# Patient Record
Sex: Female | Born: 1965 | Hispanic: Yes | Marital: Single | Smoking: Never smoker
Health system: Southern US, Community
[De-identification: ages and names within clinical notes are randomized; demographics above are authoritative.]

## PROBLEM LIST (undated history)

## (undated) DIAGNOSIS — K8020 Calculus of gallbladder without cholecystitis without obstruction: Secondary | ICD-10-CM

## (undated) DIAGNOSIS — R7303 Prediabetes: Secondary | ICD-10-CM

## (undated) DIAGNOSIS — K2970 Gastritis, unspecified, without bleeding: Secondary | ICD-10-CM

## (undated) HISTORY — PX: OTHER SURGICAL HISTORY: SHX169

## (undated) HISTORY — DX: Calculus of gallbladder without cholecystitis without obstruction: K80.20

---

## 2004-06-03 ENCOUNTER — Ambulatory Visit: Payer: Self-pay | Admitting: Obstetrics and Gynecology

## 2005-11-14 ENCOUNTER — Inpatient Hospital Stay: Payer: Self-pay

## 2007-08-17 ENCOUNTER — Ambulatory Visit: Payer: Self-pay

## 2013-03-22 LAB — HM HIV SCREENING LAB: HM HIV Screening: NEGATIVE

## 2014-11-20 DIAGNOSIS — E669 Obesity, unspecified: Secondary | ICD-10-CM | POA: Insufficient documentation

## 2015-04-19 ENCOUNTER — Encounter: Payer: Self-pay | Admitting: Adult Health

## 2015-04-19 DIAGNOSIS — Z833 Family history of diabetes mellitus: Secondary | ICD-10-CM

## 2015-04-19 DIAGNOSIS — E663 Overweight: Secondary | ICD-10-CM | POA: Diagnosis present

## 2015-04-19 DIAGNOSIS — Z6829 Body mass index (BMI) 29.0-29.9, adult: Secondary | ICD-10-CM

## 2015-04-19 DIAGNOSIS — K8020 Calculus of gallbladder without cholecystitis without obstruction: Secondary | ICD-10-CM | POA: Diagnosis present

## 2015-04-19 DIAGNOSIS — K851 Biliary acute pancreatitis without necrosis or infection: Principal | ICD-10-CM | POA: Diagnosis present

## 2015-04-19 DIAGNOSIS — E876 Hypokalemia: Secondary | ICD-10-CM | POA: Diagnosis present

## 2015-04-19 MED ORDER — GI COCKTAIL ~~LOC~~
30.0000 mL | Freq: Once | ORAL | Status: AC
  Administered 2015-04-19: 30 mL via ORAL
  Filled 2015-04-19: qty 30

## 2015-04-19 NOTE — ED Notes (Signed)
Presents with epigastric pain began at 10 pm tonight described as intermittent and "like air is trapped in my stomach" pain began after eating beef, rice and tortillas this evening. Pain is associated with Feeling dizzy and bilateral hand numbness. Pt is  Hyperventilating. Denies nauseas and SOB.  Deep breaths and sitting down makes pain worse.

## 2015-04-20 ENCOUNTER — Inpatient Hospital Stay
Admission: EM | Admit: 2015-04-20 | Discharge: 2015-04-23 | DRG: 419 | Disposition: A | Discharge status: Home / Self Care | Payer: Medicaid Other | Attending: Internal Medicine | Admitting: Internal Medicine

## 2015-04-20 ENCOUNTER — Encounter: Payer: Self-pay | Admitting: Internal Medicine

## 2015-04-20 ENCOUNTER — Emergency Department: Payer: Medicaid Other

## 2015-04-20 DIAGNOSIS — E876 Hypokalemia: Secondary | ICD-10-CM | POA: Diagnosis present

## 2015-04-20 DIAGNOSIS — K851 Biliary acute pancreatitis without necrosis or infection: Secondary | ICD-10-CM | POA: Diagnosis present

## 2015-04-20 DIAGNOSIS — E663 Overweight: Secondary | ICD-10-CM | POA: Diagnosis present

## 2015-04-20 DIAGNOSIS — K8020 Calculus of gallbladder without cholecystitis without obstruction: Secondary | ICD-10-CM | POA: Diagnosis present

## 2015-04-20 DIAGNOSIS — K810 Acute cholecystitis: Secondary | ICD-10-CM

## 2015-04-20 DIAGNOSIS — Z6829 Body mass index (BMI) 29.0-29.9, adult: Secondary | ICD-10-CM | POA: Diagnosis not present

## 2015-04-20 DIAGNOSIS — Z833 Family history of diabetes mellitus: Secondary | ICD-10-CM | POA: Diagnosis not present

## 2015-04-20 DIAGNOSIS — K858 Other acute pancreatitis without necrosis or infection: Secondary | ICD-10-CM

## 2015-04-20 HISTORY — DX: Gastritis, unspecified, without bleeding: K29.70

## 2015-04-20 LAB — COMPREHENSIVE METABOLIC PANEL WITH GFR
ALT: 49 U/L (ref 14–54)
AST: 64 U/L — ABNORMAL HIGH (ref 15–41)
Albumin: 4.2 g/dL (ref 3.5–5.0)
Alkaline Phosphatase: 78 U/L (ref 38–126)
Anion gap: 11 (ref 5–15)
BUN: 12 mg/dL (ref 6–20)
CO2: 22 mmol/L (ref 22–32)
Calcium: 8.9 mg/dL (ref 8.9–10.3)
Chloride: 102 mmol/L (ref 101–111)
Creatinine, Ser: 0.74 mg/dL (ref 0.44–1.00)
GFR calc Af Amer: >60 mL/min
GFR calc non Af Amer: >60 mL/min
Glucose, Bld: 151 mg/dL — ABNORMAL HIGH (ref 65–99)
Potassium: 3 mmol/L — ABNORMAL LOW (ref 3.5–5.1)
Sodium: 135 mmol/L (ref 135–145)
Total Bilirubin: 0.5 mg/dL (ref 0.3–1.2)
Total Protein: 7.4 g/dL (ref 6.5–8.1)

## 2015-04-20 LAB — CBC
HEMATOCRIT: 37 % (ref 35.0–47.0)
Hemoglobin: 12.5 g/dL (ref 12.0–16.0)
MCH: 28 pg (ref 26.0–34.0)
MCHC: 33.7 g/dL (ref 32.0–36.0)
MCV: 82.9 fL (ref 80.0–100.0)
Platelets: 193 10*3/uL (ref 150–440)
RBC: 4.46 MIL/uL (ref 3.80–5.20)
RDW: 13 % (ref 11.5–14.5)
WBC: 12.9 10*3/uL — AB (ref 3.6–11.0)

## 2015-04-20 LAB — TSH: TSH: 1.259 u[IU]/mL (ref 0.350–4.500)

## 2015-04-20 LAB — HEMOGLOBIN A1C: Hgb A1c MFr Bld: 5.9 % (ref 4.0–6.0)

## 2015-04-20 LAB — TROPONIN I: Troponin I: <0.03 ng/mL (ref <0.031)

## 2015-04-20 LAB — LIPASE, BLOOD: LIPASE: 3514 U/L — AB (ref 11–51)

## 2015-04-20 MED ORDER — ACETAMINOPHEN 325 MG PO TABS: 650.0000 mg | ORAL_TABLET | Freq: Four times a day (QID) | ORAL | Status: DC | PRN

## 2015-04-20 MED ORDER — MORPHINE SULFATE (PF) 2 MG/ML IV SOLN
2.0000 mg | Freq: Once | INTRAVENOUS | Status: AC
  Administered 2015-04-20: 2 mg via INTRAVENOUS

## 2015-04-20 MED ORDER — ONDANSETRON HCL 4 MG/2ML IJ SOLN
INTRAMUSCULAR | Status: AC
  Administered 2015-04-20: 4 mg via INTRAVENOUS
  Filled 2015-04-20: qty 2

## 2015-04-20 MED ORDER — MORPHINE SULFATE (PF) 2 MG/ML IV SOLN
2.0000 mg | INTRAVENOUS | Status: DC | PRN
Start: 2015-04-20 — End: 2015-04-23
  Administered 2015-04-20 – 2015-04-22 (×2): 2 mg via INTRAVENOUS
  Filled 2015-04-20 (×2): qty 1

## 2015-04-20 MED ORDER — ONDANSETRON HCL 4 MG/2ML IJ SOLN
4.0000 mg | Freq: Four times a day (QID) | INTRAMUSCULAR | Status: DC | PRN
  Administered 2015-04-22: 4 mg via INTRAVENOUS

## 2015-04-20 MED ORDER — POTASSIUM CHLORIDE IN NACL 40-0.9 MEQ/L-% IV SOLN
INTRAVENOUS | Status: DC
  Administered 2015-04-20 – 2015-04-22 (×6): 125 mL/h via INTRAVENOUS
  Filled 2015-04-20 (×9): qty 1000

## 2015-04-20 MED ORDER — MORPHINE SULFATE (PF) 2 MG/ML IV SOLN
INTRAVENOUS | Status: AC
  Administered 2015-04-20: 2 mg via INTRAVENOUS
  Filled 2015-04-20: qty 1

## 2015-04-20 MED ORDER — DEXTROSE 5 % IV SOLN
1.0000 g | INTRAVENOUS | Status: DC
  Administered 2015-04-20 – 2015-04-22 (×3): 1 g via INTRAVENOUS
  Filled 2015-04-20 (×4): qty 10

## 2015-04-20 MED ORDER — ACETAMINOPHEN 650 MG RE SUPP: 650.0000 mg | Freq: Four times a day (QID) | RECTAL | Status: DC | PRN

## 2015-04-20 MED ORDER — ONDANSETRON HCL 4 MG/2ML IJ SOLN
4.0000 mg | Freq: Once | INTRAMUSCULAR | Status: AC
  Administered 2015-04-20: 4 mg via INTRAVENOUS

## 2015-04-20 MED ORDER — PANTOPRAZOLE SODIUM 40 MG IV SOLR
40.0000 mg | INTRAVENOUS | Status: DC
  Administered 2015-04-20 – 2015-04-22 (×3): 40 mg via INTRAVENOUS
  Filled 2015-04-20 (×3): qty 40

## 2015-04-20 MED ORDER — HEPARIN SODIUM (PORCINE) 5000 UNIT/ML IJ SOLN
5000.0000 [IU] | Freq: Three times a day (TID) | INTRAMUSCULAR | Status: DC
  Administered 2015-04-20 – 2015-04-23 (×10): 5000 [IU] via SUBCUTANEOUS
  Filled 2015-04-20 (×10): qty 1

## 2015-04-20 MED ORDER — ONDANSETRON HCL 4 MG PO TABS: 4.0000 mg | ORAL_TABLET | Freq: Four times a day (QID) | ORAL | Status: DC | PRN

## 2015-04-20 NOTE — H&P (Signed)
Heather Branch is an 49 y.o. female.   Chief Complaint: Abdominal pain HPI: The patient presents emergency department complaining of abdominal pain. It began 2 days ago and has gradually worsened. Patient denies vomiting but has had areas of nausea. She denies fever, chest pain or shortness of breath. In the emergency department laboratory evaluation revealed pancreatitis. CT of the abdomen revealed gallstones. Due to these findings the emergency department called for admission   Past Medical History  Diagnosis Date  . Gastritis     Past Surgical History  Procedure Laterality Date  . None      Family History  Problem Relation Age of Onset  . Diabetes Mellitus II Father    Social History:  reports that she has never smoked. She does not have any smokeless tobacco history on file. She reports that she does not drink alcohol or use illicit drugs.  Allergies: No Known Allergies  Medications Prior to Admission  Medication Sig Dispense Refill  . omeprazole (PRILOSEC OTC) 20 MG tablet Take 20 mg by mouth daily as needed.      Results for orders placed or performed during the hospital encounter of 04/20/15 (from the past 48 hour(s))  Lipase, blood     Status: Abnormal   Collection Time: 04/19/15 11:41 PM  Result Value Ref Range   Lipase 3514 (H) 11 - 51 U/L    Comment: Please note change in reference range.  Comprehensive metabolic panel     Status: Abnormal   Collection Time: 04/19/15 11:41 PM  Result Value Ref Range   Sodium 135 135 - 145 mmol/L   Potassium 3.0 (L) 3.5 - 5.1 mmol/L   Chloride 102 101 - 111 mmol/L   CO2 22 22 - 32 mmol/L   Glucose, Bld 151 (H) 65 - 99 mg/dL   BUN 12 6 - 20 mg/dL   Creatinine, Ser 0.74 0.44 - 1.00 mg/dL   Calcium 8.9 8.9 - 10.3 mg/dL   Total Protein 7.4 6.5 - 8.1 g/dL   Albumin 4.2 3.5 - 5.0 g/dL   AST 64 (H) 15 - 41 U/L   ALT 49 14 - 54 U/L   Alkaline Phosphatase 78 38 - 126 U/L   Total Bilirubin 0.5 0.3 - 1.2 mg/dL   GFR calc non Af  Amer >60 >60 mL/min   GFR calc Af Amer >60 >60 mL/min    Comment: (NOTE) The eGFR has been calculated using the CKD EPI equation. This calculation has not been validated in all clinical situations. eGFR's persistently <60 mL/min signify possible Chronic Kidney Disease.    Anion gap 11 5 - 15  CBC     Status: Abnormal   Collection Time: 04/19/15 11:41 PM  Result Value Ref Range   WBC 12.9 (H) 3.6 - 11.0 K/uL   RBC 4.46 3.80 - 5.20 MIL/uL   Hemoglobin 12.5 12.0 - 16.0 g/dL   HCT 37.0 35.0 - 47.0 %   MCV 82.9 80.0 - 100.0 fL   MCH 28.0 26.0 - 34.0 pg   MCHC 33.7 32.0 - 36.0 g/dL   RDW 13.0 11.5 - 14.5 %   Platelets 193 150 - 440 K/uL  Troponin I     Status: None   Collection Time: 04/19/15 11:41 PM  Result Value Ref Range   Troponin I <0.03 <0.031 ng/mL    Comment:        NO INDICATION OF MYOCARDIAL INJURY.   TSH     Status: None   Collection  Time: 04/20/15  6:00 AM  Result Value Ref Range   TSH 1.259 0.350 - 4.500 uIU/mL   US Abdomen Limited  04/20/2015  CLINICAL DATA:  Acute onset of right upper quadrant and epigastric pain. Elevated lipase. Initial encounter. EXAM: US ABDOMEN LIMITED - RIGHT UPPER QUADRANT COMPARISON:  None. FINDINGS: Gallbladder: The gallbladder is largely contracted, with multiple stones seen in the gallbladder, including a 1.8 cm stone near the gallbladder neck. There is associated gallbladder wall thickening, thought to be chronic in nature. No pericholecystic fluid is seen. No ultrasonographic Murphy's sign is elicited. Common bile duct: Diameter: 0.3 cm, within normal limits in caliber. Liver: No focal lesion identified. Within normal limits in parenchymal echogenicity. IMPRESSION: Gallbladder largely contracted, with multiple stones in the gallbladder, including a 1.8 cm stone near the gallbladder neck. Associated chronic gallbladder wall thickening, thought to reflect chronic inflammation. No evidence for obstruction or acute cholecystitis. Electronically  Signed   By: Garald Balding M.D.   On: 04/20/2015 04:24    Review of Systems  Constitutional: Negative for fever and chills.  HENT: Negative for sore throat and tinnitus.   Eyes: Negative for blurred vision and redness.  Respiratory: Negative for cough and shortness of breath.   Cardiovascular: Negative for chest pain, palpitations, orthopnea and PND.  Gastrointestinal: Positive for nausea and abdominal pain. Negative for vomiting and diarrhea.  Genitourinary: Negative for dysuria, urgency and frequency.  Musculoskeletal: Negative for myalgias and joint pain.  Skin: Negative for rash.       No lesions  Neurological: Negative for speech change, focal weakness and weakness.  Endo/Heme/Allergies: Does not bruise/bleed easily.       No temperature intolerance  Psychiatric/Behavioral: Negative for depression and suicidal ideas.    Blood pressure 112/50, pulse 67, temperature 97.5 F (36.4 C), temperature source Oral, resp. rate 16, height $RemoveBe'5\' 3"'tYwtMgihx$  (1.6 m), weight 74.299 kg (163 lb 12.8 oz), last menstrual period 03/20/2015, SpO2 99 %. Physical Exam  Nursing note and vitals reviewed. Constitutional: She is oriented to person, place, and time. She appears well-developed and well-nourished. No distress.  HENT:  Head: Normocephalic and atraumatic.  Mouth/Throat: Oropharynx is clear and moist.  Eyes: Conjunctivae and EOM are normal. Pupils are equal, round, and reactive to light. No scleral icterus.  Neck: Normal range of motion. Neck supple. No JVD present. No tracheal deviation present. No thyromegaly present.  Cardiovascular: Normal rate, regular rhythm and normal heart sounds.  Exam reveals no gallop and no friction rub.   No murmur heard. Respiratory: Effort normal and breath sounds normal.  GI: Soft. Bowel sounds are normal. She exhibits no distension and no mass. There is tenderness. There is no rebound and no guarding.  Genitourinary:  Deferred  Musculoskeletal: Normal range of motion.  She exhibits no edema or tenderness.  Lymphadenopathy:    She has no cervical adenopathy.  Neurological: She is alert and oriented to person, place, and time. No cranial nerve deficit. She exhibits normal muscle tone.  Skin: Skin is warm and dry. No rash noted. No erythema.  Psychiatric: She has a normal mood and affect. Her behavior is normal. Judgment and thought content normal.     Assessment/Plan This is a 49 year old Hispanic female admitted for gallstone pancreatitis. 1. Gallstone pancreatitis: The patient is nothing by mouth. I have given her a dose of ceftriaxone and placed gastroenterology consult for possible ERCP. Manage nausea and abdominal pain. 2. Leukocytosis: No signs or symptoms of sepsis  3. Overweight: BMI is 29.1;  encouraged healthy diet and exercise 4. DVT prophylaxis: Heparin 5. GI prophylaxis: None The patient is a full code. Time spent on admission was inpatient care approximately 35 minutes   Harrie Foreman 04/20/2015, 8:01 AM

## 2015-04-20 NOTE — Progress Notes (Signed)
Initial Nutrition Assessment   INTERVENTION:   Coordination of Care: await diet progression as medically able   NUTRITION DIAGNOSIS:   Inadequate oral intake related to inability to eat as evidenced by NPO status.  GOAL:   Patient will meet greater than or equal to 90% of their needs  MONITOR:    (Energy Intake, Gastrointestinal Profile)  REASON FOR ASSESSMENT:   Diagnosis    ASSESSMENT:   Pt admitted with pancreatitis secondary to gallstones present on CT pre MD note; GI consult pending. Pt resting this am on visit.  Past Medical History  Diagnosis Date  . Gastritis     Diet Order:  Diet NPO time specified    Current Nutrition: Pt NPO   Food/Nutrition-Related History: Per MST no decrease in appetite PTA   Scheduled Medications:  . cefTRIAXone (ROCEPHIN)  IV  1 g Intravenous Q24H  . heparin  5,000 Units Subcutaneous 3 times per day    Continuous Medications:  . 0.9 % NaCl with KCl 40 mEq / L 125 mL/hr (04/20/15 0734)     Electrolyte/Renal Profile and Glucose Profile:   Recent Labs Lab 04/19/15 2341  NA 135  K 3.0*  CL 102  CO2 22  BUN 12  CREATININE 0.74  CALCIUM 8.9  GLUCOSE 151*   Protein Profile:  Recent Labs Lab 04/19/15 2341  ALBUMIN 4.2    Gastrointestinal Profile: Last BM: unknown   Nutrition-Focused Physical Exam Findings:  Unable to complete Nutrition-Focused physical exam at this time.     Weight Change: Per MST no decrease in weight PTA   Height:   Ht Readings from Last 1 Encounters:  04/20/15 5\' 3"  (1.6 m)    Weight:   Wt Readings from Last 1 Encounters:  04/20/15 163 lb 12.8 oz (74.299 kg)     BMI:  Body mass index is 29.02 kg/(m^2).  Estimated Nutritional Needs:   Kcal:  BEE: 1339kcals, TEE: (IF 1.1-1.3)(AF 1.2) [id number]  Protein:  59-74g protein (0.8-1.0g/kg)  Fluid:  1850-222820mL of fluid (25-2630mL/kg)  EDUCATION NEEDS:   Education needs no appropriate at this time   MODERATE Care  Level  Leda QuailAllyson Morgana Rowley, RD, LDN Pager [phone]

## 2015-04-20 NOTE — ED Notes (Signed)
Translator left pt room.

## 2015-04-20 NOTE — ED Notes (Signed)
Marsha on 2c updated with pt status and meds admin'd

## 2015-04-20 NOTE — ED Provider Notes (Signed)
[facility] Emergency Department Provider Note  ____________________________________________  Time seen: 1:40 AM  I have reviewed the triage vital signs and the nursing notes.   HISTORY  Chief Complaint Abdominal Pain     HPI Heather Branch is a 49 y.o. female presents with epigastric abdominal pain that is currently 5 out of 10 onset at 10:00 PM tonight. Patient states that she ate approximately 7:00 started having severe abdominal pain at 10. Patient admits to one prior episode of same that lasted approximately one hour with spontaneous resolution. Patient describes the pain as intermittent but never completely resolved. Patient denies any fever, admits to nausea but no vomiting.    Past medical history None There are no active problems to display for this patient.   Past surgical history None No current outpatient prescriptions on file.  Allergies Review of patient's allergies indicates no known allergies.  History reviewed. No pertinent family history.  Social History Social History  Substance Use Topics  . Smoking status: Never Smoker   . Smokeless tobacco: None  . Alcohol Use: No    Review of Systems  Constitutional: Negative for fever. Eyes: Negative for visual changes. ENT: Negative for sore throat. Cardiovascular: Negative for chest pain. Respiratory: Negative for shortness of breath. Gastrointestinal: Positive for abdominal pain, negative for vomiting and diarrhea. Genitourinary: Negative for dysuria. Musculoskeletal: Negative for back pain. Skin: Negative for rash. Neurological: Negative for headaches, focal weakness or numbness.   10-point ROS otherwise negative.  ____________________________________________   PHYSICAL EXAM:  VITAL SIGNS: ED Triage Vitals  Enc Vitals Group     BP 04/19/15 2335 133/65 mmHg     Pulse Rate 04/19/15 2335 80     Resp 04/19/15 2335 18     Temp 04/19/15 2335 97.8 F (36.6 C)      Temp src --      SpO2 04/19/15 2335 98 %     Weight 04/19/15 2335 170 lb (77.111 kg)     Height 04/19/15 2335 5\' 3"  (1.6 m)     Head Cir --      Peak Flow --      Pain Score 04/19/15 2336 9     Pain Loc --      Pain Edu? --      Excl. in GC? --      Constitutional: Alert and oriented. Well appearing and in no distress. Eyes: Conjunctivae are normal. PERRL. Normal extraocular movements. ENT   Head: Normocephalic and atraumatic.   Nose: No congestion/rhinnorhea.   Mouth/Throat: Mucous membranes are moist.   Neck: No stridor. Hematological/Lymphatic/Immunilogical: No cervical lymphadenopathy. Cardiovascular: Normal rate, regular rhythm. Normal and symmetric distal pulses are present in all extremities. No murmurs, rubs, or gallops. Respiratory: Normal respiratory effort without tachypnea nor retractions. Breath sounds are clear and equal bilaterally. No wheezes/rales/rhonchi. Gastrointestinal: Right upper quadrant/epigastric pain with palpation. No distention. There is no CVA tenderness. Genitourinary: deferred Musculoskeletal: Nontender with normal range of motion in all extremities. No joint effusions.  No lower extremity tenderness nor edema. Neurologic:  Normal speech and language. No gross focal neurologic deficits are appreciated. Speech is normal.  Skin:  Skin is warm, dry and intact. No rash noted. Psychiatric: Mood and affect are normal. Speech and behavior are normal. Patient exhibits appropriate insight and judgment.  ____________________________________________    LABS (pertinent positives/negatives) Labs Reviewed  LIPASE, BLOOD - Abnormal; Notable for the following:    Lipase 3514 (*)    All other components within normal  limits  COMPREHENSIVE METABOLIC PANEL - Abnormal; Notable for the following:    Potassium 3.0 (*)    Glucose, Bld 151 (*)    AST 64 (*)    All other components within normal limits  CBC - Abnormal; Notable for the following:    WBC  12.9 (*)    All other components within normal limits  TROPONIN I     RADIOLOGY     US Abdomen Limited (Final result) Result time: 04/20/15 04:24:10   Final result by Rad Results In Interface (04/20/15 04:24:10)   Narrative:   CLINICAL DATA: Acute onset of right upper quadrant and epigastric pain. Elevated lipase. Initial encounter.  EXAM: US ABDOMEN LIMITED - RIGHT UPPER QUADRANT  COMPARISON: None.  FINDINGS: Gallbladder:  The gallbladder is largely contracted, with multiple stones seen in the gallbladder, including a 1.8 cm stone near the gallbladder neck. There is associated gallbladder wall thickening, thought to be chronic in nature. No pericholecystic fluid is seen. No ultrasonographic Murphy's sign is elicited.  Common bile duct:  Diameter: 0.3 cm, within normal limits in caliber.  Liver:  No focal lesion identified. Within normal limits in parenchymal echogenicity.  IMPRESSION: Gallbladder largely contracted, with multiple stones in the gallbladder, including a 1.8 cm stone near the gallbladder neck. Associated chronic gallbladder wall thickening, thought to reflect chronic inflammation. No evidence for obstruction or acute cholecystitis.   Electronically Signed By: Roanna Raider M.D. On: 04/20/2015 04:24          INITIAL IMPRESSION / ASSESSMENT AND PLAN / ED COURSE  Pertinent labs & imaging results that were available during my care of the patient were reviewed by me and considered in my medical decision making (see chart for details).  Strip physical exam consistent with pancreatitis most likely secondary to gallstones as such patient underwent a ultrasound which revealed a before mentioned. Patient discussed with Dr. Sheryle Hail hospital admission for further evaluation and management.  ____________________________________________   FINAL CLINICAL IMPRESSION(S) / ED DIAGNOSES  Final diagnoses:  Other acute pancreatitis   Gallstone pancreatitis      Darci Current, MD 04/20/15 0502

## 2015-04-20 NOTE — Consult Note (Signed)
GI Inpatient Consult Note  Reason for Consult: pancreatitis   Attending Requesting Consult: Dr Allena Katz  History of Present Illness: Heather Branch is a 49 y.o. female with no significant past medical history who presented to the emergency room for evaluation of epigastric pain. Heather Branch reports she was in her usual state of health till 3 days prior to presentation. At that time she developed severe epigastric abdominal pain. Pain persisted for several days prompting admission to the emergency room. Not had pain like this prior. The pain was not accompanied by nausea or vomiting. He did not have trouble with rectal bleeding or black stools. She denies dysphagia, GERD. She does not drink alcohol and does not no other prior history of gallstones. Our no new medications. There is no family history of pancreatic disease.  In the emergency room she was noted to have an elevated lipase and was diagnosed with pancreatitis. She had an ultrasound of her right upper quadrant showing a large gallstone 2 cm in size in the gallbladder neck associated chronic inflammation of the gallbladder wall. All other gallstones in the gallbladder.  Today Heather Branch has improved some clinically. , Pain has decreased prior to presentation. She does continue to require IV pain medicine. She has not yet taken anything by mouth.  Past Medical History:  Past Medical History  Diagnosis Date  . Gastritis     Problem List: Patient Active Problem List   Diagnosis Date Noted  . Gallstone pancreatitis 04/20/2015    Past Surgical History: Past Surgical History  Procedure Laterality Date  . None      Allergies: No Known Allergies  Home Medications: Prescriptions prior to admission  Medication Sig Dispense Refill Last Dose  . omeprazole (PRILOSEC OTC) 20 MG tablet Take 20 mg by mouth daily as needed.   Past Month at prn   Home medication reconciliation was completed with the patient.   Scheduled Inpatient  Medications:   . cefTRIAXone (ROCEPHIN)  IV  1 g Intravenous Q24H  . heparin  5,000 Units Subcutaneous 3 times per day    Continuous Inpatient Infusions:   . 0.9 % NaCl with KCl 40 mEq / L 125 mL/hr (04/20/15 0734)    PRN Inpatient Medications:  acetaminophen **OR** acetaminophen, morphine injection, ondansetron **OR** ondansetron (ZOFRAN) IV  Family History: family history includes Diabetes Mellitus II in her father.    Social History:   reports that she has never smoked. She does not have any smokeless tobacco history on file. She reports that she does not drink alcohol or use illicit drugs.   Review of Systems: Constitutional: Weight is stable.  Eyes: No changes in vision. ENT: No oral lesions, sore throat.  GI: see HPI.  Heme/Lymph: No easy bruising.  CV: No chest pain.  GU: No hematuria.  Integumentary: No rashes.  Neuro: No headaches.  Psych: No depression/anxiety.  Endocrine: No heat/cold intolerance.  Allergic/Immunologic: No urticaria.  Resp: No cough, SOB.  Musculoskeletal: No joint swelling.    Physical Examination: BP 114/58 mmHg  Pulse 74  Temp(Src) 98.2 F (36.8 C) (Oral)  Resp 16  Ht  (1.6 m)  Wt 74.299 kg (163 lb 12.8 oz)  BMI 29.02 kg/m2  SpO2 97%  LMP 03/20/2015 (Approximate) Gen: NAD, alert and oriented x 4 HEENT: PEERLA, EOMI, Neck: supple, no JVD or thyromegaly Chest: CTA bilaterally, no wheezes, crackles, or other adventitious sounds CV: RRR, no m/g/c/r Abd: soft, + diffuse TTP, worse in epigastric region, +BS in all  four quadrants; no HSM, guarding, ridigity, or rebound tenderness, + abd adiposity Ext: no edema, well perfused with 2+ pulses, Skin: no rash or lesions noted Lymph: no LAD  Data: Lab Results  Component Value Date   WBC 12.9* 04/19/2015   HGB 12.5 04/19/2015   HCT 37.0 04/19/2015   MCV 82.9 04/19/2015   PLT 193 04/19/2015    Recent Labs Lab 04/19/15 2341  HGB 12.5   Lab Results  Component Value Date   NA  135 04/19/2015   K 3.0* 04/19/2015   CL 102 04/19/2015   CO2 22 04/19/2015   BUN 12 04/19/2015   CREATININE 0.74 04/19/2015   Lab Results  Component Value Date   ALT 49 04/19/2015   AST 64* 04/19/2015   ALKPHOS 78 04/19/2015   BILITOT 0.5 04/19/2015   No results for input(s): APTT, INR, PTT in the last 168 hours.   Assessment/Plan: Heather Branch is a 49 y.o. female with gallstone pancreatitis. Her lab work and ultrasound are not suggestive of any ongoing biliary obstruction or cholangitis. She is also improving somewhat clinically. There is no indication for ERCP at this time.  Recommendations: - Surgical consult for consideration of cholecystectomy - advance diet to clear liquids  avoiding fatty chicken broth - Continue to monitor liver enzymes - cont symptomatically management as you are doing - No indication for ERCP  Thank you for the consult. Please call with questions or concerns.  Kanya Potteiger, Addison NaegeliMATTHEW GORDON, MD

## 2015-04-20 NOTE — H&P (Signed)
Heather Branch is an 49 y.o. female.   Chief Complaint: Epigastric pain  HPI:  49 yr old Hispanic female, spoke through interpreter, presented to ED last night after having epigastric pain.  Patient states that is started about 10 pm after eating some beef,rice and torillas.  She has had a similar type pain in the past about 3 months ago but it only lasted for about 30 mins.  She states that the pain feels as if it bores through to her back but denies any migration otherwise. She states that she feels more bloated and has more gas pressure feeling, as if the gas won't pass.  At this time she says her pain is a 2 down from 8.  She denies any fever, chills, nausea, vomiting, diarrhea or constipation.   Past Medical History  Diagnosis Date  . Gastritis     Past Surgical History  Procedure Laterality Date  . None      Family History  Problem Relation Age of Onset  . Diabetes Mellitus II Father    Social History:  reports that she has never smoked. She does not have any smokeless tobacco history on file. She reports that she does not drink alcohol or use illicit drugs.  Allergies: No Known Allergies  Medications Prior to Admission  Medication Sig Dispense Refill  . omeprazole (PRILOSEC OTC) 20 MG tablet Take 20 mg by mouth daily as needed.      Results for orders placed or performed during the hospital encounter of 04/20/15 (from the past 48 hour(s))  Lipase, blood     Status: Abnormal   Collection Time: 04/19/15 11:41 PM  Result Value Ref Range   Lipase 3514 (H) 11 - 51 U/L    Comment: Please note change in reference range. RESULTS CONFIRMED BY MANUAL DILUTION   Comprehensive metabolic panel     Status: Abnormal   Collection Time: 04/19/15 11:41 PM  Result Value Ref Range   Sodium 135 135 - 145 mmol/L   Potassium 3.0 (L) 3.5 - 5.1 mmol/L   Chloride 102 101 - 111 mmol/L   CO2 22 22 - 32 mmol/L   Glucose, Bld 151 (H) 65 - 99 mg/dL   BUN 12 6 - 20 mg/dL   Creatinine, Ser 0.74  0.44 - 1.00 mg/dL   Calcium 8.9 8.9 - 10.3 mg/dL   Total Protein 7.4 6.5 - 8.1 g/dL   Albumin 4.2 3.5 - 5.0 g/dL   AST 64 (H) 15 - 41 U/L   ALT 49 14 - 54 U/L   Alkaline Phosphatase 78 38 - 126 U/L   Total Bilirubin 0.5 0.3 - 1.2 mg/dL   GFR calc non Af Amer >60 >60 mL/min   GFR calc Af Amer >60 >60 mL/min    Comment: (NOTE) The eGFR has been calculated using the CKD EPI equation. This calculation has not been validated in all clinical situations. eGFR's persistently <60 mL/min signify possible Chronic Kidney Disease.    Anion gap 11 5 - 15  CBC     Status: Abnormal   Collection Time: 04/19/15 11:41 PM  Result Value Ref Range   WBC 12.9 (H) 3.6 - 11.0 K/uL   RBC 4.46 3.80 - 5.20 MIL/uL   Hemoglobin 12.5 12.0 - 16.0 g/dL   HCT 37.0 35.0 - 47.0 %   MCV 82.9 80.0 - 100.0 fL   MCH 28.0 26.0 - 34.0 pg   MCHC 33.7 32.0 - 36.0 g/dL   RDW 13.0 11.5 -  14.5 %   Platelets 193 150 - 440 K/uL  Troponin I     Status: None   Collection Time: 04/19/15 11:41 PM  Result Value Ref Range   Troponin I <0.03 <0.031 ng/mL    Comment:        NO INDICATION OF MYOCARDIAL INJURY.   Hemoglobin A1c     Status: None   Collection Time: 04/20/15  6:00 AM  Result Value Ref Range   Hgb A1c MFr Bld 5.9 4.0 - 6.0 %  TSH     Status: None   Collection Time: 04/20/15  6:00 AM  Result Value Ref Range   TSH 1.259 0.350 - 4.500 uIU/mL   US Abdomen Limited  04/20/2015  CLINICAL DATA:  Acute onset of right upper quadrant and epigastric pain. Elevated lipase. Initial encounter. EXAM: US ABDOMEN LIMITED - RIGHT UPPER QUADRANT COMPARISON:  None. FINDINGS: Gallbladder: The gallbladder is largely contracted, with multiple stones seen in the gallbladder, including a 1.8 cm stone near the gallbladder neck. There is associated gallbladder wall thickening, thought to be chronic in nature. No pericholecystic fluid is seen. No ultrasonographic Murphy's sign is elicited. Common bile duct: Diameter: 0.3 cm, within normal  limits in caliber. Liver: No focal lesion identified. Within normal limits in parenchymal echogenicity. IMPRESSION: Gallbladder largely contracted, with multiple stones in the gallbladder, including a 1.8 cm stone near the gallbladder neck. Associated chronic gallbladder wall thickening, thought to reflect chronic inflammation. No evidence for obstruction or acute cholecystitis. Electronically Signed   By: Garald Balding M.D.   On: 04/20/2015 04:24    Review of Systems  Constitutional: Negative for fever, chills, weight loss, malaise/fatigue and diaphoresis.  HENT: Negative for congestion and sore throat.   Respiratory: Negative for shortness of breath and wheezing.   Cardiovascular: Negative for chest pain, palpitations and leg swelling.  Gastrointestinal: Positive for heartburn and abdominal pain. Negative for nausea, vomiting, diarrhea, constipation, blood in stool and melena.  Genitourinary: Negative for dysuria, urgency, frequency, hematuria and flank pain.  Musculoskeletal: Positive for back pain. Negative for myalgias, joint pain, falls and neck pain.  Skin: Negative for itching and rash.  Neurological: Negative for dizziness, tremors, loss of consciousness, weakness and headaches.  Psychiatric/Behavioral: Negative for memory loss. The patient does not have insomnia.   All other systems reviewed and are negative.   Blood pressure 114/58, pulse 74, temperature 98.2 F (36.8 C), temperature source Oral, resp. rate 16, height _0  (1.6 m), weight 163 lb 12.8 oz (74.299 kg), last menstrual period 03/20/2015, SpO2 97 %. Physical Exam  Vitals reviewed. Constitutional: She is oriented to person, place, and time. She appears well-developed and well-nourished. No distress.  HENT:  Head: Normocephalic and atraumatic.  Right Ear: External ear normal.  Left Ear: External ear normal.  Nose: Nose normal.  Mouth/Throat: Oropharynx is clear and moist. No oropharyngeal exudate.  Eyes: Conjunctivae  and EOM are normal. Pupils are equal, round, and reactive to light. No scleral icterus.  Neck: Normal range of motion. Neck supple. No tracheal deviation present.  Cardiovascular: Normal rate, regular rhythm, normal heart sounds and intact distal pulses.  Exam reveals no gallop and no friction rub.   No murmur heard. Respiratory: Effort normal and breath sounds normal. No respiratory distress. She has no wheezes. She has no rales.  GI: Soft. Bowel sounds are normal. She exhibits no distension. There is tenderness. There is no rebound and no guarding.  Epigastric tenderness, mild pain in RUQ but no murphy's  sign  Musculoskeletal: Normal range of motion. She exhibits no edema or tenderness.  Neurological: She is alert and oriented to person, place, and time. No cranial nerve deficit.  Skin: Skin is warm and dry. No rash noted. No erythema. No pallor.  Psychiatric: She has a normal mood and affect. Her behavior is normal. Judgment and thought content normal.     Assessment/Plan 49 yr old female with gallstone pancreatitis.  I have personally reviewed her lab, with a lipase of 3514 and otherwise normal liver enzymes.  I have reviewed the images showing a large stone in the gallbladder and some wall thickening, CBD 3 mm.  I have also reviewed the radiology reads.  I would recommend continuing NPO, will need Lap chole during this admission but ideally will perform when lipase normalizes.  Will start protonix, continue npo and recheck lipase in AM.    The patient and mother-in-law did ask if there were any medical options.  I explained that given pancreatitis and the severe risks that come with that the current recommendation would be removal of the gallbladder.  Also explained that medicine like Lucrezia Starch has not been proven to decrease the likelihood of recurrent gallstone pancreatitis or cholangitis in the future.   I discussed the risks, benefits, complications, treatment options, and expected outcomes  were discussed with the patient. The possibilities of bleeding, recurrent infection, finding a normal gallbladder, perforation of viscus organs, damage to surrounding structures, bile leak, abscess formation, needing a drain placed, the need for additional procedures, reaction to medication, pulmonary aspiration,  failure to diagnose a condition, the possible need to convert to an open procedure, and creating a complication requiring transfusion or operation were discussed with the patient. Patient and mother-in-law given the opportunity to ask questions and have them answered and agree with procedure.   I spent 90 mins with patient with over 50% of that time explaining the disease process, treatment options, education and coordination of care.   Heather Branch 04/20/2015, 5:55 PM

## 2015-04-20 NOTE — ED Notes (Signed)
requesting Spanish translator for tx

## 2015-04-20 NOTE — Progress Notes (Signed)
[facility] - [place] at [facility]                                                                                                                                                                                            Patient Demographics   Heather Branch, is a 49 y.o. female, DOB - [birth year], MWN:[id number]  Admit date - 04/20/2015   Admitting Physician Arnaldo Natal, MD  Outpatient Primary MD for the patient is No PCP Per Patient   LOS - 0  Subjective: Patient admitted with severe abdominal pain noted have acute pancreatitis     Review of Systems:   CONSTITUTIONAL: No documented fever. No fatigue, weakness. No weight gain, no weight loss.  EYES: No blurry or double vision.  ENT: No tinnitus. No postnasal drip. No redness of the oropharynx.  RESPIRATORY: No cough, no wheeze, no hemoptysis. No dyspnea.  CARDIOVASCULAR: No chest pain. No orthopnea. No palpitations. No syncope.  GASTROINTESTINAL: Positive nausea, no vomiting or diarrhea. Positive abdominal pain. No melena or hematochezia.  GENITOURINARY: No dysuria or hematuria.  ENDOCRINE: No polyuria or nocturia. No heat or cold intolerance.  HEMATOLOGY: No anemia. No bruising. No bleeding.  INTEGUMENTARY: No rashes. No lesions.  MUSCULOSKELETAL: No arthritis. No swelling. No gout.  NEUROLOGIC: No numbness, tingling, or ataxia. No seizure-type activity.  PSYCHIATRIC: No anxiety. No insomnia. No ADD.    Vitals:   Filed Vitals:   04/20/15 0300 04/20/15 0330 04/20/15 0500 04/20/15 0609  BP: 120/74 112/71 124/80 112/50  Pulse: 77 70 76 67  Temp:    97.5 F (36.4 C)  TempSrc:    Oral  Resp:    16  Height:      Weight:      SpO2: 99% 98% 98% 99%    Wt Readings from Last 3 Encounters:  04/20/15 74.299 kg (163 lb 12.8 oz)     Intake/Output Summary (Last 24 hours) at 04/20/15 1239 Last data filed at 04/20/15 1200  Gross per 24 hour  Intake 554.17 ml  Output    300 ml  Net 254.17  ml    Physical Exam:   GENERAL: Pleasant-appearing in no apparent distress.  HEAD, EYES, EARS, NOSE AND THROAT: Atraumatic, normocephalic. Extraocular muscles are intact. Pupils equal and reactive to light. Sclerae anicteric. No conjunctival injection. No oro-pharyngeal erythema.  NECK: Supple. There is no jugular venous distention. No bruits, no lymphadenopathy, no thyromegaly.  HEART: Regular rate and rhythm,. No murmurs, no rubs, no clicks.  LUNGS: Clear to auscultation bilaterally. No rales or rhonchi. No wheezes.  ABDOMEN: Soft, flat, epigastric tenderness, nondistended. Has  good bowel sounds. No hepatosplenomegaly appreciated.  EXTREMITIES: No evidence of any cyanosis, clubbing, or peripheral edema.  +2 pedal and radial pulses bilaterally.  NEUROLOGIC: The patient is alert, awake, and oriented x3 with no focal motor or sensory deficits appreciated bilaterally.  SKIN: Moist and warm with no rashes appreciated.  Psych: Not anxious, depressed LN: No inguinal LN enlargement    Antibiotics   Anti-infectives    Start     Dose/Rate Route Frequency Ordered Stop   04/20/15 0545  cefTRIAXone (ROCEPHIN) 1 g in dextrose 5 % 50 mL IVPB     1 g 100 mL/hr over 30 Minutes Intravenous Every 24 hours 04/20/15 0544        Medications   Scheduled Meds: . cefTRIAXone (ROCEPHIN)  IV  1 g Intravenous Q24H  . heparin  5,000 Units Subcutaneous 3 times per day   Continuous Infusions: . 0.9 % NaCl with KCl 40 mEq / L 125 mL/hr (04/20/15 0734)   PRN Meds:.acetaminophen **OR** acetaminophen, morphine injection, ondansetron **OR** ondansetron (ZOFRAN) IV   Data Review:   Micro Results No results found for this or any previous visit (from the past 240 hour(s)).  Radiology Reports [place] Abdomen Limited  04/20/2015  CLINICAL DATA:  Acute onset of right upper quadrant and epigastric pain. Elevated lipase. Initial encounter. EXAM: US ABDOMEN LIMITED - RIGHT UPPER QUADRANT COMPARISON:  None. FINDINGS:  Gallbladder: The gallbladder is largely contracted, with multiple stones seen in the gallbladder, including a 1.8 cm stone near the gallbladder neck. There is associated gallbladder wall thickening, thought to be chronic in nature. No pericholecystic fluid is seen. No ultrasonographic Murphy's sign is elicited. Common bile duct: Diameter: 0.3 cm, within normal limits in caliber. Liver: No focal lesion identified. Within normal limits in parenchymal echogenicity. IMPRESSION: Gallbladder largely contracted, with multiple stones in the gallbladder, including a 1.8 cm stone near the gallbladder neck. Associated chronic gallbladder wall thickening, thought to reflect chronic inflammation. No evidence for obstruction or acute cholecystitis. Electronically Signed   By: Roanna RaiderJeffery  Chang M.D.   On: 04/20/2015 04:24     CBC  Recent Labs Lab 04/19/15 2341  WBC 12.9*  HGB 12.5  HCT 37.0  PLT 193  MCV 82.9  MCH 28.0  MCHC 33.7  RDW 13.0    Chemistries   Recent Labs Lab 04/19/15 2341  NA 135  K 3.0*  CL 102  CO2 22  GLUCOSE 151*  BUN 12  CREATININE 0.74  CALCIUM 8.9  AST 64*  ALT 49  ALKPHOS 78  BILITOT 0.5   ------------------------------------------------------------------------------------------------------------------ estimated creatinine clearance is 83.1 mL/min (by C-G formula based on Cr of 0.74). ------------------------------------------------------------------------------------------------------------------ No results for input(s): HGBA1C in the last 72 hours. ------------------------------------------------------------------------------------------------------------------ No results for input(s): CHOL, HDL, LDLCALC, TRIG, CHOLHDL, LDLDIRECT in the last 72 hours. ------------------------------------------------------------------------------------------------------------------  Recent Labs  04/20/15 0600  TSH 1.259    ------------------------------------------------------------------------------------------------------------------ No results for input(s): VITAMINB12, FOLATE, FERRITIN, TIBC, IRON, RETICCTPCT in the last 72 hours.  Coagulation profile No results for input(s): INR, PROTIME in the last 168 hours.  No results for input(s): DDIMER in the last 72 hours.  Cardiac Enzymes  Recent Labs Lab 04/19/15 2341  TROPONINI <0.03   ------------------------------------------------------------------------------------------------------------------ Invalid input(s): POCBNP    Assessment & Plan   1. Acute  Pancreatitis: Likely due to gallstone pancreatitis, keep nothing by mouth GI evaluation pending will need ERCP and eventual gallbladder surgery. Increase IV fluids continue pain control follow lipase in the morning  2 hypokalemia replace  potassium and repeat in the morning  3, miscellaneous heparin for DVT prophylaxis.      Code Status Orders        Start     Ordered   04/20/15 0545  Full code   Continuous     04/20/15 0544           Consults  GI   DVT Prophylaxis  heparin  Lab Results  Component Value Date   PLT 193 04/19/2015     Time Spent in minutes  45 minutes  Greater than 50% of time spent in care coordination and counseling.   Auburn Bilberry M.D on 04/20/2015 at 12:39 PM  Between 7am to 6pm - Pager - [phone]  After 6pm go to [email or web address] - password [email or web address] [facility]  [facility]   Office  [phone]

## 2015-04-21 LAB — COMPREHENSIVE METABOLIC PANEL
ALBUMIN: 3.7 g/dL (ref 3.5–5.0)
ALT: 170 U/L — ABNORMAL HIGH (ref 14–54)
AST: 97 U/L — AB (ref 15–41)
Alkaline Phosphatase: 86 U/L (ref 38–126)
Anion gap: 5 (ref 5–15)
BUN: 8 mg/dL (ref 6–20)
CHLORIDE: 112 mmol/L — AB (ref 101–111)
CO2: 22 mmol/L (ref 22–32)
Calcium: 8.8 mg/dL — ABNORMAL LOW (ref 8.9–10.3)
Creatinine, Ser: 0.55 mg/dL (ref 0.44–1.00)
GFR calc Af Amer: >60 mL/min (ref >60)
Glucose, Bld: 89 mg/dL (ref 65–99)
POTASSIUM: 4.5 mmol/L (ref 3.5–5.1)
SODIUM: 139 mmol/L (ref 135–145)
Total Bilirubin: 0.4 mg/dL (ref 0.3–1.2)
Total Protein: 6.8 g/dL (ref 6.5–8.1)

## 2015-04-21 LAB — SURGICAL PCR SCREEN
MRSA, PCR: NEGATIVE
Staphylococcus aureus: NEGATIVE

## 2015-04-21 LAB — LIPASE, BLOOD: LIPASE: 86 U/L — AB (ref 11–51)

## 2015-04-21 NOTE — Progress Notes (Signed)
Medical Interpreter called consent obtained for Laparoscopic Cholecystectomy as ordered by Dr. Orvis BrillLoflin. Procedure may be done today. Dr. Orvis Brillloflin will notify writer if procedure will be done today. Patient kept NPO for now and diet status will reevaluated this afternoon.

## 2015-04-21 NOTE — Progress Notes (Signed)
49  Yr old female with gallstone pancreatitis.  Patient doing well, pain resolved  Filed Vitals:   04/21/15 1216  BP: 108/63  Pulse: 68  Temp: 98.2 F (36.8 C)  Resp: 16   I/O last 3 completed shifts: In: 2452.2 [I.V.:2452.2] Out: 700 [Urine:700]     PE:  GEN: NAD Abd: Soft, tenderness in epigastrium resolved Ext: 2+ pulses no edema  CBC Latest Ref Rng 04/19/2015  WBC 3.6 - 11.0 K/uL 12.9(H)  Hemoglobin 12.0 - 16.0 g/dL 16.112.5  Hematocrit 09.635.0 - 47.0 % 37.0  Platelets 150 - 440 K/uL 193    CMP Latest Ref Rng 04/21/2015 04/19/2015  Glucose 65 - 99 mg/dL 89 045(W151(H)  BUN 6 - 20 mg/dL 8 12  Creatinine 0.980.44 - 1.00 mg/dL 1.190.55 1.470.74  Sodium 829135 - 145 mmol/L 139 135  Potassium 3.5 - 5.1 mmol/L 4.5 3.0(L)  Chloride 101 - 111 mmol/L 112(H) 102  CO2 22 - 32 mmol/L 22 22  Calcium 8.9 - 10.3 mg/dL 5.6(O8.8(L) 8.9  Total Protein 6.5 - 8.1 g/dL 6.8 7.4  Total Bilirubin 0.3 - 1.2 mg/dL 0.4 0.5  Alkaline Phos 38 - 126 U/L 86 78  AST 15 - 41 U/L 97(H) 64(H)  ALT 14 - 54 U/L 170(H) 49    Lipase     Component Value Date/Time   LIPASE 86* 04/21/2015 0606     A/P:  49 yr old with gallstone pancreatitis Resolving now, lipase down to 86 from 3000 and pain resolved.  Will set her up for Lap chole tomorrow with my partner Dr. Tonita CongWoodham.

## 2015-04-21 NOTE — Progress Notes (Signed)
[facility] Physicians - [place] at [facility]                                                                                                                                                                                            Patient Demographics   Heather Branch, is a 49 y.o. female, DOB - [birth year], [id number]  Admit date - 04/20/2015   Admitting Physician Arnaldo Natal, MD  Outpatient Primary MD for the patient is No PCP Per Patient   LOS - 1  Subjective: Patient's abdominal pain now resolved. Lipase is normalized. She is hungry    Review of Systems:   CONSTITUTIONAL: No documented fever. No fatigue, weakness. No weight gain, no weight loss.  EYES: No blurry or double vision.  ENT: No tinnitus. No postnasal drip. No redness of the oropharynx.  RESPIRATORY: No cough, no wheeze, no hemoptysis. No dyspnea.  CARDIOVASCULAR: No chest pain. No orthopnea. No palpitations. No syncope.  GASTROINTESTINAL: No nausea, no vomiting or diarrhea. No abdominal pain. No melena or hematochezia.  GENITOURINARY: No dysuria or hematuria.  ENDOCRINE: No polyuria or nocturia. No heat or cold intolerance.  HEMATOLOGY: No anemia. No bruising. No bleeding.  INTEGUMENTARY: No rashes. No lesions.  MUSCULOSKELETAL: No arthritis. No swelling. No gout.  NEUROLOGIC: No numbness, tingling, or ataxia. No seizure-type activity.  PSYCHIATRIC: No anxiety. No insomnia. No ADD.    Vitals:   Filed Vitals:   04/20/15 1347 04/20/15 2033 04/21/15 0618 04/21/15 0638  BP: 114/58 109/63 107/63   Pulse: 74 65 72   Temp: 98.2 F (36.8 C) 98 F (36.7 C) 98.1 F (36.7 C)   TempSrc: Oral Oral Oral   Resp: Height:      Weight:    76.93 kg (169 lb 9.6 oz)  SpO2: 97% 99% 100%     Wt Readings from Last 3 Encounters:  04/21/15 76.93 kg (169 lb 9.6 oz)     Intake/Output Summary (Last 24 hours) at 04/21/15 1104 Last data filed at 04/21/15 0406  Gross per 24 hour  Intake    2398 ml  Output    400 ml  Net   1998 ml    Physical Exam:   GENERAL: Pleasant-appearing in no apparent distress.  HEAD, EYES, EARS, NOSE AND THROAT: Atraumatic, normocephalic. Extraocular muscles are intact. Pupils equal and reactive to light. Sclerae anicteric. No conjunctival injection. No oro-pharyngeal erythema.  NECK: Supple. There is no jugular venous distention. No bruits, no lymphadenopathy, no thyromegaly.  HEART: Regular rate and rhythm,. No murmurs, no rubs, no clicks.  LUNGS: Clear to auscultation  bilaterally. No rales or rhonchi. No wheezes.  ABDOMEN: Soft, flat, no epigastric tenderness, nondistended. Has good bowel sounds. No hepatosplenomegaly appreciated.  EXTREMITIES: No evidence of any cyanosis, clubbing, or peripheral edema.  +2 pedal and radial pulses bilaterally.  NEUROLOGIC: The patient is alert, awake, and oriented x3 with no focal motor or sensory deficits appreciated bilaterally.  SKIN: Moist and warm with no rashes appreciated.  Psych: Not anxious, depressed LN: No inguinal LN enlargement    Antibiotics   Anti-infectives    Start     Dose/Rate Route Frequency Ordered Stop   04/20/15 0545  cefTRIAXone (ROCEPHIN) 1 g in dextrose 5 % 50 mL IVPB     1 g 100 mL/hr over 30 Minutes Intravenous Every 24 hours 04/20/15 0544        Medications   Scheduled Meds: . cefTRIAXone (ROCEPHIN)  IV  1 g Intravenous Q24H  . heparin  5,000 Units Subcutaneous 3 times per day  . pantoprazole (PROTONIX) IV  40 mg Intravenous Q24H   Continuous Infusions: . 0.9 % NaCl with KCl 40 mEq / L 125 mL/hr (04/21/15 0250)   PRN Meds:.acetaminophen **OR** acetaminophen, morphine injection, ondansetron **OR** ondansetron (ZOFRAN) IV   Data Review:   Micro Results No results found for this or any previous visit (from the past 240 hour(s)).  Radiology Reports US Abdomen Limited  04/20/2015  CLINICAL DATA:  Acute onset of right upper quadrant and epigastric pain. Elevated  lipase. Initial encounter. EXAM: US ABDOMEN LIMITED - RIGHT UPPER QUADRANT COMPARISON:  None. FINDINGS: Gallbladder: The gallbladder is largely contracted, with multiple stones seen in the gallbladder, including a 1.8 cm stone near the gallbladder neck. There is associated gallbladder wall thickening, thought to be chronic in nature. No pericholecystic fluid is seen. No ultrasonographic Murphy's sign is elicited. Common bile duct: Diameter: 0.3 cm, within normal limits in caliber. Liver: No focal lesion identified. Within normal limits in parenchymal echogenicity. IMPRESSION: Gallbladder largely contracted, with multiple stones in the gallbladder, including a 1.8 cm stone near the gallbladder neck. Associated chronic gallbladder wall thickening, thought to reflect chronic inflammation. No evidence for obstruction or acute cholecystitis. Electronically Signed   By: Roanna Raider M.D.   On: 04/20/2015 04:24     CBC  Recent Labs Lab 04/19/15 2341  WBC 12.9*  HGB 12.5  HCT 37.0  PLT 193  MCV 82.9  MCH 28.0  MCHC 33.7  RDW 13.0    Chemistries   Recent Labs Lab 04/19/15 2341 04/21/15 0606  NA 135 139  K 3.0* 4.5  CL 102 112*  CO2 22 22  GLUCOSE 151* 89  BUN 12 8  CREATININE 0.74 0.55  CALCIUM 8.9 8.8*  AST 64* 97*  ALT 49 170*  ALKPHOS 78 86  BILITOT 0.5 0.4   ------------------------------------------------------------------------------------------------------------------ estimated creatinine clearance is 84.4 mL/min (by C-G formula based on Cr of 0.55). ------------------------------------------------------------------------------------------------------------------  Recent Labs  04/20/15 0600  HGBA1C 5.9   ------------------------------------------------------------------------------------------------------------------ No results for input(s): CHOL, HDL, LDLCALC, TRIG, CHOLHDL, LDLDIRECT in the last 72  hours. ------------------------------------------------------------------------------------------------------------------  Recent Labs  04/20/15 0600  TSH 1.259   ------------------------------------------------------------------------------------------------------------------ No results for input(s): VITAMINB12, FOLATE, FERRITIN, TIBC, IRON, RETICCTPCT in the last 72 hours.  Coagulation profile No results for input(s): INR, PROTIME in the last 168 hours.  No results for input(s): DDIMER in the last 72 hours.  Cardiac Enzymes  Recent Labs Lab 04/19/15 2341  TROPONINI <0.03   ------------------------------------------------------------------------------------------------------------------ Invalid input(s): POCBNP    Assessment &  Plan   1. Acute  Pancreatitis: Likely due to gallstone pancreatitis,  pancreatitis seems to have resolved, her LFTs slightly worst, will need cholecystectomy timing per surgery, repeat LFTs in the morning, clear liquid diet today if no plan for surgery today  2 hypokalemia replace replaced potassium   3, miscellaneous heparin for DVT prophylaxis.      Code Status Orders        Start     Ordered   04/20/15 0545  Full code   Continuous     04/20/15 0544           Consults  GI   DVT Prophylaxis  heparin  Lab Results  Component Value Date   PLT 193 04/19/2015     Time Spent in minutes  35 minutes  Greater than 50% of time spent in care coordination and counseling.   Auburn BilberryPATEL, Niam Nepomuceno M.D on 04/21/2015 at 11:04 AM  Between 7am to 6pm - Pager - [phone]  After 6pm go to [email or web address] - password [email or web address]  [facility]   Office  [phone]

## 2015-04-22 ENCOUNTER — Inpatient Hospital Stay: Payer: Medicaid Other | Admitting: Anesthesiology

## 2015-04-22 ENCOUNTER — Inpatient Hospital Stay: Payer: Medicaid Other

## 2015-04-22 ENCOUNTER — Encounter
Admission: EM | Disposition: A | Discharge status: Home / Self Care | Payer: Self-pay | Source: Home / Self Care | Attending: Internal Medicine

## 2015-04-22 ENCOUNTER — Encounter: Payer: Self-pay | Admitting: Anesthesiology

## 2015-04-22 HISTORY — PX: CHOLECYSTECTOMY: SHX55

## 2015-04-22 LAB — PREGNANCY, URINE: PREG TEST UR: NEGATIVE

## 2015-04-22 SURGERY — LAPAROSCOPIC CHOLECYSTECTOMY WITH INTRAOPERATIVE CHOLANGIOGRAM
Anesthesia: General

## 2015-04-22 MED ORDER — OXYCODONE-ACETAMINOPHEN 5-325 MG PO TABS
1.0000 | ORAL_TABLET | ORAL | Status: DC | PRN
  Administered 2015-04-22 – 2015-04-23 (×2): 1 via ORAL
  Filled 2015-04-22 (×2): qty 1

## 2015-04-22 MED ORDER — LIDOCAINE HCL (CARDIAC) 20 MG/ML IV SOLN
INTRAVENOUS | Status: DC | PRN
  Administered 2015-04-22: 40 mg via INTRAVENOUS

## 2015-04-22 MED ORDER — ROCURONIUM BROMIDE 100 MG/10ML IV SOLN
INTRAVENOUS | Status: DC | PRN
  Administered 2015-04-22: 5 mg via INTRAVENOUS
  Administered 2015-04-22: 35 mg via INTRAVENOUS

## 2015-04-22 MED ORDER — SUCCINYLCHOLINE CHLORIDE 20 MG/ML IJ SOLN
INTRAMUSCULAR | Status: DC | PRN
  Administered 2015-04-22: 100 mg via INTRAVENOUS

## 2015-04-22 MED ORDER — PROPOFOL 10 MG/ML IV BOLUS
INTRAVENOUS | Status: DC | PRN
  Administered 2015-04-22: 40 mg via INTRAVENOUS
  Administered 2015-04-22: 160 mg via INTRAVENOUS

## 2015-04-22 MED ORDER — OXYCODONE HCL 5 MG PO TABS: 5.0000 mg | ORAL_TABLET | Freq: Once | ORAL | Status: DC | PRN

## 2015-04-22 MED ORDER — DEXAMETHASONE SODIUM PHOSPHATE 4 MG/ML IJ SOLN
INTRAMUSCULAR | Status: DC | PRN
  Administered 2015-04-22: 8 mg via INTRAVENOUS

## 2015-04-22 MED ORDER — LACTATED RINGERS IV SOLN
INTRAVENOUS | Status: DC | PRN
  Administered 2015-04-22: 12:00:00 via INTRAVENOUS

## 2015-04-22 MED ORDER — FENTANYL CITRATE (PF) 100 MCG/2ML IJ SOLN
25.0000 ug | INTRAMUSCULAR | Status: DC | PRN
Start: 2015-04-22 — End: 2015-04-23

## 2015-04-22 MED ORDER — SODIUM CHLORIDE 0.9 % IV SOLN
INTRAVENOUS | Status: AC
  Administered 2015-04-22 – 2015-04-23 (×2): via INTRAVENOUS

## 2015-04-22 MED ORDER — MIDAZOLAM HCL 2 MG/2ML IJ SOLN
INTRAMUSCULAR | Status: DC | PRN
  Administered 2015-04-22: 2 mg via INTRAVENOUS

## 2015-04-22 MED ORDER — OXYCODONE HCL 5 MG/5ML PO SOLN: 5.0000 mg | Freq: Once | ORAL | Status: DC | PRN

## 2015-04-22 MED ORDER — FENTANYL CITRATE (PF) 100 MCG/2ML IJ SOLN
INTRAMUSCULAR | Status: DC | PRN
  Administered 2015-04-22: 100 ug via INTRAVENOUS
  Administered 2015-04-22 (×3): 50 ug via INTRAVENOUS

## 2015-04-22 MED ORDER — CEFAZOLIN SODIUM-DEXTROSE 2-3 GM-% IV SOLR
INTRAVENOUS | Status: DC | PRN
  Administered 2015-04-22: 2 g via INTRAVENOUS

## 2015-04-22 SURGICAL SUPPLY — 49 items
ADHESIVE MASTISOL STRL (MISCELLANEOUS) IMPLANT
APPLIER CLIP ROT 10 11.4 M/L (STAPLE) ×3
BAG COUNTER SPONGE EZ (MISCELLANEOUS) ×2 IMPLANT
BLADE SURG SZ11 CARB STEEL (BLADE) ×3 IMPLANT
BULB RESERV EVAC DRAIN JP 100C (MISCELLANEOUS) IMPLANT
CANISTER SUCT 1200ML W/VALVE (MISCELLANEOUS) ×3 IMPLANT
CATH REDDICK CHOLANGI 4FR 50CM (CATHETERS) ×3 IMPLANT
CHLORAPREP W/TINT 26ML (MISCELLANEOUS) ×3 IMPLANT
CLIP APPLIE ROT 10 11.4 M/L (STAPLE) ×1 IMPLANT
CLOSURE WOUND 1/2 X4 (GAUZE/BANDAGES/DRESSINGS)
CONRAY 60ML FOR OR (MISCELLANEOUS) ×3 IMPLANT
COUNTER SPONGE BAG EZ (MISCELLANEOUS) ×1
DISSECTOR KITTNER STICK (MISCELLANEOUS) ×1 IMPLANT
DISSECTORS/KITTNER STICK (MISCELLANEOUS) ×3
DRAIN CHANNEL JP 19F (MISCELLANEOUS) IMPLANT
DRAPE SHEET LG 3/4 BI-LAMINATE (DRAPES) ×3 IMPLANT
DRSG TEGADERM 2-3/8X2-3/4 SM (GAUZE/BANDAGES/DRESSINGS) ×12 IMPLANT
DRSG TELFA 3X8 NADH (GAUZE/BANDAGES/DRESSINGS) ×3 IMPLANT
ENDOPOUCH RETRIEVER 10 (MISCELLANEOUS) ×3 IMPLANT
GLOVE BIO SURGEON STRL SZ7.5 (GLOVE) ×3 IMPLANT
GLOVE INDICATOR 8.0 STRL GRN (GLOVE) ×3 IMPLANT
GOWN STRL REUS W/ TWL LRG LVL3 (GOWN DISPOSABLE) ×3 IMPLANT
GOWN STRL REUS W/TWL LRG LVL3 (GOWN DISPOSABLE) ×6
IRRIGATION STRYKERFLOW (MISCELLANEOUS) ×1 IMPLANT
IRRIGATOR STRYKERFLOW (MISCELLANEOUS) ×3
IV CATH ANGIO 12GX3 LT BLUE (NEEDLE) IMPLANT
IV NS 1000ML (IV SOLUTION)
IV NS 1000ML BAXH (IV SOLUTION) IMPLANT
L-HOOK LAP DISP 36CM (ELECTROSURGICAL) ×3
LABEL OR SOLS (LABEL) ×3 IMPLANT
LHOOK LAP DISP 36CM (ELECTROSURGICAL) ×1 IMPLANT
LIQUID BAND (GAUZE/BANDAGES/DRESSINGS) IMPLANT
NEEDLE HYPO 25X1 1.5 SAFETY (NEEDLE) ×3 IMPLANT
NEEDLE VERESS 14GA 120MM (NEEDLE) ×3 IMPLANT
NS IRRIG 500ML POUR BTL (IV SOLUTION) ×3 IMPLANT
PACK LAP CHOLECYSTECTOMY (MISCELLANEOUS) ×3 IMPLANT
PAD GROUND ADULT SPLIT (MISCELLANEOUS) ×3 IMPLANT
PENCIL ELECTRO HAND CTR (MISCELLANEOUS) ×3 IMPLANT
SCISSORS METZENBAUM CVD 33 (INSTRUMENTS) ×3 IMPLANT
SLEEVE ENDOPATH XCEL 5M (ENDOMECHANICALS) ×6 IMPLANT
STRAP SAFETY BODY (MISCELLANEOUS) ×3 IMPLANT
STRIP CLOSURE SKIN 1/2X4 (GAUZE/BANDAGES/DRESSINGS) IMPLANT
SUT MNCRL 4-0 (SUTURE) ×2
SUT MNCRL 4-0 27XMFL (SUTURE) ×1
SUT VICRYL 0 AB UR-6 (SUTURE) ×3 IMPLANT
SUTURE MNCRL 4-0 27XMF (SUTURE) ×1 IMPLANT
TROCAR XCEL NON-BLD 11X100MML (ENDOMECHANICALS) ×3 IMPLANT
TROCAR XCEL NON-BLD 5MMX100MML (ENDOMECHANICALS) ×3 IMPLANT
TUBING INSUFFLATOR HI FLOW (MISCELLANEOUS) ×3 IMPLANT

## 2015-04-22 NOTE — Transfer of Care (Signed)
Immediate Anesthesia Transfer of Care Note  Patient: Heather Branch  Procedure(s) Performed: Procedure(s): LAPAROSCOPIC CHOLECYSTECTOMY WITH INTRAOPERATIVE CHOLANGIOGRAM (N/A)  Patient Location: PACU  Anesthesia Type:General  Level of Consciousness: awake  Airway & Oxygen Therapy: Patient Spontanous Breathing and Patient connected to face mask oxygen  Post-op Assessment: Report given to RN and Post -op Vital signs reviewed and stable  Post vital signs: Reviewed and stable  Last Vitals:  Filed Vitals:   04/22/15 1342  BP: 124/95  Pulse: 53  Temp: 36.8 C  Resp: 10    Complications: No apparent anesthesia complications

## 2015-04-22 NOTE — Progress Notes (Signed)
Pt came to floor post-op at approximately 1440. VSS. Dressings are c/d/i. Pt c/o abdominal pain. IV morphine administered. Will continue to monitor.

## 2015-04-22 NOTE — Progress Notes (Signed)
[facility] - [place] at [facility]                                                                                                                                                                                            Patient Demographics   Heather ShoveJuana Branch Branch, is a 49 y.o. female, DOB - 1965-11-27, [id number]:[id number]  Admit date - 04/20/2015   Admitting Physician Arnaldo NatalMichael S Diamond, MD  Outpatient Primary MD for the patient is No PCP Per Patient   LOS - 2  Subjective: Plan for lap cholecystectomy later today denies any symptoms  Review of Systems:   CONSTITUTIONAL: No documented fever. No fatigue, weakness. No weight gain, no weight loss.  EYES: No blurry or double vision.  ENT: No tinnitus. No postnasal drip. No redness of the oropharynx.  RESPIRATORY: No cough, no wheeze, no hemoptysis. No dyspnea.  CARDIOVASCULAR: No chest pain. No orthopnea. No palpitations. No syncope.  GASTROINTESTINAL: No nausea, no vomiting or diarrhea. No abdominal pain. No melena or hematochezia.  GENITOURINARY: No dysuria or hematuria.  ENDOCRINE: No polyuria or nocturia. No heat or cold intolerance.  HEMATOLOGY: No anemia. No bruising. No bleeding.  INTEGUMENTARY: No rashes. No lesions.  MUSCULOSKELETAL: No arthritis. No swelling. No gout.  NEUROLOGIC: No numbness, tingling, or ataxia. No seizure-type activity.  PSYCHIATRIC: No anxiety. No insomnia. No ADD.    Vitals:   Filed Vitals:   04/21/15 1216 04/21/15 2053 04/22/15 0500 04/22/15 0507  BP: 108/63 126/72  111/50  Pulse: 68 72  60  Temp: 98.2 F (36.8 C) 98 F (36.7 C)  98.2 F (36.8 C)  TempSrc: Oral Oral  Oral  Resp: 16 14  16   Height:      Weight:   75.569 kg (166 lb 9.6 oz)   SpO2: 100% 99%  98%    Wt Readings from Last 3 Encounters:  04/22/15 75.569 kg (166 lb 9.6 oz)     Intake/Output Summary (Last 24 hours) at 04/22/15 1337 Last data filed at 04/22/15 1325  Gross per 24 hour  Intake 3712.09 ml   Output      0 ml  Net 3712.09 ml    Physical Exam:   GENERAL: Pleasant-appearing in no apparent distress.  HEAD, EYES, EARS, NOSE AND THROAT: Atraumatic, normocephalic. Extraocular muscles are intact. Pupils equal and reactive to light. Sclerae anicteric. No conjunctival injection. No oro-pharyngeal erythema.  NECK: Supple. There is no jugular venous distention. No bruits, no lymphadenopathy, no thyromegaly.  HEART: Regular rate and rhythm,. No murmurs, no rubs, no clicks.  LUNGS: Clear to auscultation bilaterally. No rales or rhonchi. No  wheezes.  ABDOMEN: Soft, flat, no epigastric tenderness, nondistended. Has good bowel sounds. No hepatosplenomegaly appreciated.  EXTREMITIES: No evidence of any cyanosis, clubbing, or peripheral edema.  +2 pedal and radial pulses bilaterally.  NEUROLOGIC: The patient is alert, awake, and oriented x3 with no focal motor or sensory deficits appreciated bilaterally.  SKIN: Moist and warm with no rashes appreciated.  Psych: Not anxious, depressed LN: No inguinal LN enlargement    Antibiotics   Anti-infectives    Start     Dose/Rate Route Frequency Ordered Stop   04/20/15 0545  cefTRIAXone (ROCEPHIN) 1 g in dextrose 5 % 50 mL IVPB     1 g 100 mL/hr over 30 Minutes Intravenous Every 24 hours 04/20/15 0544        Medications   Scheduled Meds: . cefTRIAXone (ROCEPHIN)  IV  1 g Intravenous Q24H  . heparin  5,000 Units Subcutaneous 3 times per day  . pantoprazole (PROTONIX) IV  40 mg Intravenous Q24H   Continuous Infusions: . sodium chloride     PRN Meds:.acetaminophen **OR** acetaminophen, morphine injection, ondansetron **OR** ondansetron (ZOFRAN) IV   Data Review:   Micro Results Recent Results (from the past 240 hour(s))  Surgical pcr screen     Status: None   Collection Time: 04/21/15 10:40 PM  Result Value Ref Range Status   MRSA, PCR NEGATIVE NEGATIVE Final   Staphylococcus aureus NEGATIVE NEGATIVE Final    Comment:        The  Xpert SA Assay (FDA approved for NASAL specimens in patients over 17 years of age), is one component of a comprehensive surveillance program.  Test performance has been validated by [facility] for patients greater than or equal to 51 year old. It is not intended to diagnose infection nor to guide or monitor treatment.     Radiology Reports US Abdomen Limited  04/20/2015  CLINICAL DATA:  Acute onset of right upper quadrant and epigastric pain. Elevated lipase. Initial encounter. EXAM: US ABDOMEN LIMITED - RIGHT UPPER QUADRANT COMPARISON:  None. FINDINGS: Gallbladder: The gallbladder is largely contracted, with multiple stones seen in the gallbladder, including a 1.8 cm stone near the gallbladder neck. There is associated gallbladder wall thickening, thought to be chronic in nature. No pericholecystic fluid is seen. No ultrasonographic Murphy's sign is elicited. Common bile duct: Diameter: 0.3 cm, within normal limits in caliber. Liver: No focal lesion identified. Within normal limits in parenchymal echogenicity. IMPRESSION: Gallbladder largely contracted, with multiple stones in the gallbladder, including a 1.8 cm stone near the gallbladder neck. Associated chronic gallbladder wall thickening, thought to reflect chronic inflammation. No evidence for obstruction or acute cholecystitis. Electronically Signed   By: Roanna Raider M.D.   On: 04/20/2015 04:24     CBC  Recent Labs Lab 04/19/15 2341  WBC 12.9*  HGB 12.5  HCT 37.0  PLT 193  MCV 82.9  MCH 28.0  MCHC 33.7  RDW 13.0    Chemistries   Recent Labs Lab 04/19/15 2341 04/21/15 0606  NA 135 139  K 3.0* 4.5  CL 102 112*  CO2 22 22  GLUCOSE 151* 89  BUN 12 8  CREATININE 0.74 0.55  CALCIUM 8.9 8.8*  AST 64* 97*  ALT 49 170*  ALKPHOS 78 86  BILITOT 0.5 0.4   ------------------------------------------------------------------------------------------------------------------ estimated creatinine clearance is 83.8 mL/min  (by C-G formula based on Cr of 0.55). ------------------------------------------------------------------------------------------------------------------  Recent Labs  04/20/15 0600  HGBA1C 5.9   ------------------------------------------------------------------------------------------------------------------ No results for input(s): CHOL, HDL,  LDLCALC, TRIG, CHOLHDL, LDLDIRECT in the last 72 hours. ------------------------------------------------------------------------------------------------------------------  Recent Labs  04/20/15 0600  TSH 1.259   ------------------------------------------------------------------------------------------------------------------ No results for input(s): VITAMINB12, FOLATE, FERRITIN, TIBC, IRON, RETICCTPCT in the last 72 hours.  Coagulation profile No results for input(s): INR, PROTIME in the last 168 hours.  No results for input(s): DDIMER in the last 72 hours.  Cardiac Enzymes  Recent Labs Lab 04/19/15 2341  TROPONINI <0.03   ------------------------------------------------------------------------------------------------------------------ Invalid input(s): POCBNP    Assessment & Plan   1. Acute  Pancreatitis: Likely due to gallstone pancreatitis,  pancreatitis seems to have resolved, lap cholecystectomy today  2 hypokalemia  replaced potassium   3, elevated LFTs repeat LFTs in the morning     Code Status Orders        Start     Ordered   04/20/15 0545  Full code   Continuous     04/20/15 0544           Consults  GI   DVT Prophylaxis  heparin  Lab Results  Component Value Date   PLT 193 04/19/2015     Time Spent in minutes 25 minutes     Auburn Bilberry M.D on 04/22/2015 at 1:37 PM  Between 7am to 6pm - Pager - [phone]  After 6pm go to [email or web address] - password [email or web address] [facility]  [facility] [place]   Office  [phone]

## 2015-04-22 NOTE — Anesthesia Postprocedure Evaluation (Signed)
  Anesthesia Post-op Note  Patient: Heather Branch  Procedure(s) Performed: Procedure(s): LAPAROSCOPIC CHOLECYSTECTOMY WITH INTRAOPERATIVE CHOLANGIOGRAM (N/A)  Anesthesia type:General ETT  Patient location: PACU  Post pain: Pain level controlled  Post assessment: Post-op Vital signs reviewed, Patient's Cardiovascular Status Stable, Respiratory Function Stable, Patent Airway and No signs of Nausea or vomiting  Post vital signs: Reviewed and stable  Last Vitals:  Filed Vitals:   04/22/15 1520  BP: 121/55  Pulse: 75  Temp: 36.3 C  Resp: 16    Level of consciousness: awake, alert  and patient cooperative  Complications: No apparent anesthesia complications

## 2015-04-22 NOTE — Op Note (Signed)
Laparoscopic Cholecystectomy  Pre-operative Diagnosis: Gallstone pancreatitis  Post-operative Diagnosis: Gallstone pancreatitis  Procedure: Laparoscopic cholecystectomy with intraoperative cholangiogram  Surgeon: Leonette Most T. Tonita Cong, MD FACS  Anesthesia: Gen. with endotracheal tube  Assistant: None  Procedure Details  The patient was seen again in the Holding Room. The benefits, complications, treatment options, and expected outcomes were discussed with the patient. The risks of bleeding, infection, recurrence of symptoms, failure to resolve symptoms, bile duct damage, bile duct leak, retained common bile duct stone, bowel injury, any of which could require further surgery and/or ERCP, stent, or papillotomy were reviewed with the patient. The likelihood of improving the patient's symptoms with return to their baseline status is good.  The patient and/or family concurred with the proposed plan, giving informed consent.  The patient was taken to Operating Room, identified as Heather Branch and the procedure verified as Laparoscopic Cholecystectomy.  A Time Out was held and the above information confirmed.  Prior to the induction of general anesthesia, antibiotic prophylaxis was administered. VTE prophylaxis was in place. General endotracheal anesthesia was then administered and tolerated well. After the induction, the abdomen was prepped with Chloraprep and draped in the sterile fashion. The patient was positioned in the supine position.  Local anesthetic  was injected into the skin near the umbilicus and an incision made. The Veress needle was placed. Pneumoperitoneum was then created with CO2 and tolerated well without any adverse changes in the patient's vital signs. A 5mm port was placed in the periumbilical position and the abdominal cavity was explored.  Two 5-mm ports were placed in the right upper quadrant and a 12 mm epigastric port was placed all under direct vision. All skin incisions   were infiltrated with a local anesthetic agent before making the incision and placing the trocars.   The patient was positioned  in reverse Trendelenburg, tilted slightly to the patient's left.  The gallbladder was identified, the fundus grasped and retracted cephalad. Adhesions were lysed bluntly. The infundibulum was grasped and retracted laterally, exposing the peritoneum overlying the triangle of Calot. This was then divided and exposed in a blunt fashion. A critical view of the cystic duct and cystic artery was obtained.  The cystic duct was clearly identified and bluntly dissected.   At this point a cholangiogram was performed using the [facility] catheter and clamp. Under fluoroscopy Conray was instilled which initially only filled the gallbladder. The clamp was repositioned under direct visualization and additional Conray was instilled which immediately flowed down the cystic duct filling a common duct and going into the duodenum. Under additional pressure at the pancreatic duct was visualized and then the right and left hepatic. There were no filling defects or any evidence of stones.  The gallbladder was taken from the gallbladder fossa in a retrograde fashion with the electrocautery. The gallbladder was removed and placed in an Endocatch bag. The liver bed was irrigated and inspected. Hemostasis was achieved with the electrocautery. Copious irrigation was utilized and was repeatedly aspirated until clear.  The gallbladder and Endocatch sac were then removed through the epigastric port site.   The Endo Catch sac for upon removal through the skin. This required copious irrigation of the abdominal cavity and the epigastric trocar site. All irrigation returned clear prior to completion of the procedure.  Inspection of the right upper quadrant was performed. No bleeding, bile duct injury or leak, or bowel injury was noted. Pneumoperitoneum was released.  The epigastric port site was closed with  figure-of-eight 0 Vicryl  sutures. 4-0 subcuticular Monocryl was used to close the skin. Steristrips and Mastisol and sterile dressings were  applied.  The patient was then extubated and brought to the recovery room in stable condition. Sponge, lap, and needle counts were correct at closure and at the conclusion of the case.   Findings: Gallstone Cholecystitis with normal cholangiogram  Estimated Blood Loss: 20         Drains: None         Specimens: Gallbladder           Complications: none               Daimian Sudberry T. Tonita CongWoodham, MD, FACS

## 2015-04-22 NOTE — Anesthesia Preprocedure Evaluation (Signed)
Anesthesia Evaluation  Patient identified by MRN, date of birth, ID band Patient awake    Reviewed: Allergy & Precautions, H&P , NPO status , Patient's Chart, lab work & pertinent test results  Airway Mallampati: II  TM Distance: >3 FB Neck ROM: full    Dental  (+) Poor Dentition, Chipped   Pulmonary neg pulmonary ROS, neg shortness of breath,    Pulmonary exam normal breath sounds clear to auscultation       Cardiovascular Exercise Tolerance: Good (-) angina(-) Past MI and (-) DOE negative cardio ROS Normal cardiovascular exam Rhythm:regular Rate:Normal     Neuro/Psych negative neurological ROS  negative psych ROS   GI/Hepatic Neg liver ROS, neg GERD  Controlled,  Endo/Other  negative endocrine ROS  Renal/GU negative Renal ROS  negative genitourinary   Musculoskeletal   Abdominal   Peds  Hematology negative hematology ROS (+)   Anesthesia Other Findings Past Medical History:   Gastritis                                                   Past Surgical History:   none                                                         BMI    Body Mass Index   29.51 kg/m 2      Reproductive/Obstetrics negative OB ROS                             Anesthesia Physical Anesthesia Plan  ASA: III  Anesthesia Plan: General ETT   Post-op Pain Management:    Induction:   Airway Management Planned:   Additional Equipment:   Intra-op Plan:   Post-operative Plan:   Informed Consent: I have reviewed the patients History and Physical, chart, labs and discussed the procedure including the risks, benefits and alternatives for the proposed anesthesia with the patient or authorized representative who has indicated his/her understanding and acceptance.   Dental Advisory Given  Plan Discussed with: Anesthesiologist, CRNA and Surgeon  Anesthesia Plan Comments:         Anesthesia Quick  Evaluation

## 2015-04-22 NOTE — Brief Op Note (Signed)
04/20/2015 - 04/22/2015  1:33 PM  PATIENT:  Heather Branch  49 y.o. female  PRE-OPERATIVE DIAGNOSIS:  gallstone pancreatitis  POST-OPERATIVE DIAGNOSIS:  Same  PROCEDURE:  Procedure(s): LAPAROSCOPIC CHOLECYSTECTOMY WITH INTRAOPERATIVE CHOLANGIOGRAM (N/A)  SURGEON:  Surgeon(s) and Role:    * Ricarda Frameharles Grazia Taffe, MD - Primary  PHYSICIAN ASSISTANT:   ASSISTANTS: none   ANESTHESIA:   general  EBL:  Total I/O In: 1453 [I.V.:1453] Out: -   BLOOD ADMINISTERED:none  DRAINS: none   LOCAL MEDICATIONS USED:  MARCAINE   , XYLOCAINE  and Amount: 20 ml  SPECIMEN:  Source of Specimen:  gallbladder  DISPOSITION OF SPECIMEN:  PATHOLOGY  COUNTS:  YES  TOURNIQUET:  * No tourniquets in log *  DICTATION: .Dragon Dictation  PLAN OF CARE: return to inpatient setting  PATIENT DISPOSITION:  PACU - hemodynamically stable.   Delay start of Pharmacological VTE agent (>24hrs) due to surgical blood loss or risk of bleeding: no

## 2015-04-22 NOTE — Anesthesia Procedure Notes (Signed)
Procedure Name: Intubation Date/Time: 04/22/2015 12:05 PM Performed by: Rosaria FerriesPISCITELLO, JOSEPH K Pre-anesthesia Checklist: Patient identified, Emergency Drugs available, Suction available, Patient being monitored and Timeout performed Patient Re-evaluated:Patient Re-evaluated prior to inductionOxygen Delivery Method: Circle system utilized Preoxygenation: Pre-oxygenation with 100% oxygen Intubation Type: IV induction Ventilation: Mask ventilation without difficulty Laryngoscope Size: Mac and 3 Grade View: Grade I Tube type: Oral Tube size: 7.0 mm Number of attempts: 1 Airway Equipment and Method: Stylet Placement Confirmation: ETT inserted through vocal cords under direct vision,  positive ETCO2 and breath sounds checked- equal and bilateral Secured at: 22 cm Tube secured with: Tape Dental Injury: Teeth and Oropharynx as per pre-operative assessment

## 2015-04-22 NOTE — Progress Notes (Signed)
CC: Gallstone Pancreatitis Subjective: Patient interviewed using interpreter. Patient reports being pain-free since Saturday. Denies any fevers, chills, nausea, vomiting. Desires to have surgery today to be done process.  Objective: Vital signs in last 24 hours: Temp:  [98 F (36.7 C)-98.2 F (36.8 C)] 98.2 F (36.8 C) (10/24 0507) Pulse Rate:  [60-72] 60 (10/24 0507) Resp:  [14-16] 16 (10/24 0507) BP: (108-126)/(50-72) 111/50 mmHg (10/24 0507) SpO2:  [98 %-100 %] 98 % (10/24 0507) Weight:  [75.569 kg (166 lb 9.6 oz)] 75.569 kg (166 lb 9.6 oz) (10/24 0500)    Intake/Output from previous day: 10/23 0701 - 10/24 0700 In: 3279.9 [P.O.:600; I.V.:2679.9] Out: -  Intake/Output this shift:    Physical exam:  Abdomen is completely benign. Denies any tenderness palpation. Abdomen is soft, nontender, nondistended.  Lab Results: CBC   Recent Labs  04/19/15 2341  WBC 12.9*  HGB 12.5  HCT 37.0  PLT 193   BMET  Recent Labs  04/19/15 2341 04/21/15 0606  NA 135 139  K 3.0* 4.5  CL 102 112*  CO2 22 22  GLUCOSE 151* 89  BUN 12 8  CREATININE 0.74 0.55  CALCIUM 8.9 8.8*   PT/INR No results for input(s): LABPROT, INR in the last 72 hours. ABG No results for input(s): PHART, HCO3 in the last 72 hours.  Invalid input(s): PCO2, PO2  Studies/Results: No results found.  Anti-infectives: Anti-infectives    Start     Dose/Rate Route Frequency Ordered Stop   04/20/15 0545  cefTRIAXone (ROCEPHIN) 1 g in dextrose 5 % 50 mL IVPB     1 g 100 mL/hr over 30 Minutes Intravenous Every 24 hours 04/20/15 0544        Assessment/Plan:  49 year old female with gallstone pancreatitis. Plan for cholecystectomy today.  I discussed the procedure in detail.  The patient was given Agricultural engineereducational material.  We discussed the risks and benefits of a laparoscopic cholecystectomy and possible cholangiogram including, but not limited to bleeding, infection, injury to surrounding structures such  as the intestine or liver, bile leak, retained gallstones, need to convert to an open procedure, prolonged diarrhea, blood clots such as  DVT, common bile duct injury, anesthesia risks, and possible need for additional procedures.  The likelihood of improvement in symptoms and return to the patient's normal status is good. We discussed the typical post-operative recovery course.   Margo Lama T. Tonita CongWoodham, MD, FACS  04/22/2015

## 2015-04-23 DIAGNOSIS — K851 Biliary acute pancreatitis without necrosis or infection: Principal | ICD-10-CM

## 2015-04-23 LAB — BASIC METABOLIC PANEL
ANION GAP: 5 (ref 5–15)
BUN: 8 mg/dL (ref 6–20)
CALCIUM: 8.9 mg/dL (ref 8.9–10.3)
CO2: 22 mmol/L (ref 22–32)
CREATININE: 0.64 mg/dL (ref 0.44–1.00)
Chloride: 109 mmol/L (ref 101–111)
GFR calc Af Amer: >60 mL/min (ref >60)
GLUCOSE: 137 mg/dL — AB (ref 65–99)
Potassium: 3.8 mmol/L (ref 3.5–5.1)
Sodium: 136 mmol/L (ref 135–145)

## 2015-04-23 LAB — CBC
HEMATOCRIT: 35.8 % (ref 35.0–47.0)
Hemoglobin: 12.1 g/dL (ref 12.0–16.0)
MCH: 27.9 pg (ref 26.0–34.0)
MCHC: 33.8 g/dL (ref 32.0–36.0)
MCV: 82.7 fL (ref 80.0–100.0)
PLATELETS: 187 10*3/uL (ref 150–440)
RBC: 4.33 MIL/uL (ref 3.80–5.20)
RDW: 12.8 % (ref 11.5–14.5)
WBC: 8.9 10*3/uL (ref 3.6–11.0)

## 2015-04-23 LAB — SURGICAL PATHOLOGY

## 2015-04-23 MED ORDER — OXYCODONE-ACETAMINOPHEN 5-325 MG PO TABS: 1.0000 | ORAL_TABLET | ORAL | Status: DC | PRN

## 2015-04-23 NOTE — Discharge Summary (Signed)
Patient ID: Heather Branch MRN: [id number] DOB/AGE: January 01, 1966 49 y.o.  Admit date: 04/20/2015 Discharge date: 04/23/2015  Discharge Diagnoses:  Gallstone pancreatitis   Procedures Performed: Laparoscopic Cholecystectomy with cholangiogram  Discharged Condition: good  Hospital Course: Admitted to hospital with gallstone pancreatitis. Once pancreatitis resolved she went to the OR for a cholecystectomy with cholangiogram. Tolerated the procedure well. Able to be discharged home on POD#1  Discharge Orders: Discharge Home, OK to shower, Do not submerge incisions. No heavy lifting until cleared by MD  Disposition: HOME  Discharge Medications:   .  oxyCODONE-acetaminophen (PERCOCET/ROXICET) 5-325 MG per tablet 1-2 tablet, 1-2 tablet, Oral, Q4H PRN, Ricarda Frameharles Lizette Pazos, MD, 1 tablet at 04/23/15 0400   Follwup: Follow-up Information    Follow up with [facility]. Schedule an appointment as soon as possible for a visit in 1 week.   Specialty:  General Surgery   Why:  For wound re-check   Contact information:   [address] [place] [id number] [phone]      Signed: Ricarda FrameCharles Inigo Lantigua 04/23/2015, 7:14 AM

## 2015-04-23 NOTE — Discharge Instructions (Signed)
CIRUGIA AMBULATORIA       Instruccionnes de alta    Date (Fecha)   1.  Las drogas que se Dispensing opticianle administraron permaneceran en su cuerpo PG&[employer] Corporationhasta [place], asi      que por las proximas 24 horas usted no debe:   Conducir Field seismologist(manejar) un automovil   Hacer ninguna decision legal   Tomar ninguna bebida alcoholica  2.  A) [place] puede comenzar una dieta regular.  Es mejor que hoy empiece con                    liquidos y gradualmente anada [address] solid[address].       B) Puede comer cualquier comida que desee pero es mejor empezar con liquidos,               luego sopitas con galletas saladas y gradualmente llegar a las comidas solidas.  3.  Por favor avise a su medico inmediatamente si usted tiene algun sangrado anormal,       tiene dificultad con la respiracion, enrojecimiento y Engineer, miningdolor en el sitio de la cirugia,     Milesdrenaje, fiebro o dolor que se alivia con [facility].  4.  A) Su visita posoperatoria (despues de su operacion) es con el  Dr.                Time         B)  Por favor llame para hacer la cita posoperatoria.  5.  Istrucciones especificas :  Colecistitis (Cholecystitis) La colecistitis es la hinchazn y la irritacin (inflamacin) de la vescula biliar, un rgano que tiene forma de pera y se encuentra debajo del hgado, del lado derecho del cuerpo. A menudo la causa de esta afeccin son los clculos en la vescula biliar. El mdico puede hacerle estudios para saber cmo funciona su vescula biliar. Estos estudios pueden Johnson & Johnsonincluir los siguientes:  Pruebas de diagnstico por imgenes, como:  Hydrologistcografa.  Resonancia magntica.  Estudios que controlan el funcionamiento del hgado. Esta afeccin requiere tratamiento. CUIDADOS EN EL HOGAR El cuidado en el hogar depender del tipo de Hanaleitratamiento. En general:  Baxter Internationalome los medicamentos de venta libre y los recetados solamente como se lo haya indicado el mdico.  Si le recetaron un antibitico, tmelo como se lo haya indicado el mdico. No deje de  tomar los antibiticos aunque comience a sentirse mejor.  Siga las indicaciones del mdico respecto de las comidas o las bebidas. Cuando le permitan comer, no coma ni beba nada que desencadene los sntomas.  Concurra a todas las visitas de control como se lo haya indicado el mdico. Esto es importante. SOLICITE AYUDA SI:  Siente dolor y los medicamentos no [address] efecto.  Tiene fiebre. SOLICITE AYUDA DE INMEDIATO SI:  El dolor se desplaza a los siguientes lugares:  Otra parte del abdomen.  La espalda.  Los sntomas no desaparecen.  Aparecen nuevos sntomas.   Esta informacin no tiene Theme park managercomo fin reemplazar el consejo del mdico. Asegrese de hacerle al mdico cualquier pregunta que tenga.   Document Released: 06/04/2011 Document Revised: 03/06/2015 Elsevier Interactive Patient Education [employer]2016 Elsevier Inc.

## 2015-04-23 NOTE — Progress Notes (Signed)
Patient A&O, VSS.  No complaints of pain. Dressings intact. Diet advanced and patient tolerated well with no complaints of pain or nausea.  Interpreter utilized for initial assessment and for discharge teaching.  Understanding was verbalized and all questions were answered.  Patient discharged home via wheelchair in stable condition escorted by volunteer staff.

## 2015-04-30 ENCOUNTER — Encounter: Payer: Self-pay | Admitting: *Deleted

## 2015-05-01 ENCOUNTER — Ambulatory Visit (INDEPENDENT_AMBULATORY_CARE_PROVIDER_SITE_OTHER): Payer: Self-pay | Admitting: General Surgery

## 2015-05-01 ENCOUNTER — Encounter: Payer: Self-pay | Admitting: General Surgery

## 2015-05-01 VITALS — BP 115/75 | HR 66 | Temp 97.5°F | Ht 64.0 in | Wt 166.0 lb

## 2015-05-01 DIAGNOSIS — K2970 Gastritis, unspecified, without bleeding: Secondary | ICD-10-CM | POA: Insufficient documentation

## 2015-05-01 DIAGNOSIS — Z4889 Encounter for other specified surgical aftercare: Secondary | ICD-10-CM

## 2015-05-01 NOTE — Progress Notes (Signed)
Outpatient Surgical Follow Up  05/01/2015  Heather Branch Branch is an 49 y.o. female.   Chief Complaint  Patient presents with  . Routine Post Op    Laparoscopic Cholecystectomy 04/22/2015 Dr. Tonita CongWoodham    HPI: 49 year old female returns to clinic 1 week status post lap scopic cholestatic. She has complaints only of gastritis/reflux and occasional discomfort above her midepigastric incision site. She denies any fevers, chills, nausea, vomiting, diarrhea, constipation. She is very happy with her surgical results and is healing well.  Past Medical History  Diagnosis Date  . Gastritis   . Cholelithiasis     Past Surgical History  Procedure Laterality Date  . Other surgical history    . Cholecystectomy N/A 04/22/2015    Procedure: LAPAROSCOPIC CHOLECYSTECTOMY WITH INTRAOPERATIVE CHOLANGIOGRAM;  Surgeon: Ricarda Frameharles Sohan Potvin, MD;  Location: [facility] ORS;  Service: General;  Laterality: N/A;    Family History  Problem Relation Age of Onset  . Diabetes Mellitus II Father   . Heart disease Father   . Kidney disease Father     Social History:  reports that she has never smoked. She does not have any smokeless tobacco history on file. She reports that she does not drink alcohol or use illicit drugs.  Allergies: No Known Allergies  Medications reviewed.    ROS Multipoint review of systems was completed. All pertinent positives negatives within the history of present illness remainder were negative.   BP 115/75 mmHg  Pulse 66  Temp(Src) 97.5 F (36.4 C) (Oral)  Ht 5\' 4"  (1.626 m)  Wt 75.297 kg (166 lb)  BMI 28.48 kg/m2  LMP 03/20/2015 (Approximate)  Physical Exam  Gen.: No acute distress Chest: Clear to auscultation Heart: Regular rate and rhythm Abdomen: Soft, nontender, nondistended. Well approximated laparoscopic cholecystectomy incisions without any evidence of infection or drainage.   No results found for this or any previous visit (from the past 48 hour(s)). No results  found.  Assessment/Plan:  1. Aftercare following surgery Discussed the normal healing process and signs and symptoms of infection. Follow up on an as-needed basis if there are any signs of infection or any worries from her surgery. Follow-up when necessary 2. Gastritis Patient with known gastritis and reflux disease. She has not been taking her Prilosec. Discussed appropriate timing and use of Prilosec to prevent reflux.     Ricarda Frameharles Damica Gravlin  05/01/2015,negative

## 2015-05-01 NOTE — Patient Instructions (Signed)
Please give us a call if you have any questions or concerns. Please Prilosec daily for the next week or two to help with your acid reflux.

## 2015-12-11 ENCOUNTER — Ambulatory Visit
Admission: RE | Admit: 2015-12-11 | Discharge: 2015-12-11 | Disposition: A | Discharge status: Home / Self Care | Payer: Self-pay | Source: Ambulatory Visit | Attending: Internal Medicine | Admitting: Internal Medicine

## 2015-12-11 ENCOUNTER — Encounter: Payer: Self-pay | Admitting: *Deleted

## 2015-12-11 ENCOUNTER — Other Ambulatory Visit: Payer: Self-pay | Admitting: *Deleted

## 2015-12-11 ENCOUNTER — Ambulatory Visit: Payer: Self-pay | Attending: Internal Medicine | Admitting: *Deleted

## 2015-12-11 VITALS — BP 124/83 | HR 74 | Temp 98.2°F | Ht 63.39 in | Wt 169.9 lb

## 2015-12-11 DIAGNOSIS — Z0000 Encounter for general adult medical examination without abnormal findings: Secondary | ICD-10-CM

## 2015-12-11 LAB — HM PAP SMEAR: HM Pap smear: NEGATIVE

## 2015-12-11 NOTE — Progress Notes (Signed)
Subjective:     Patient ID: Heather ShoveJuana Branch Branch, female   DOB: July 22, 1965, 50 y.o.   MRN: [id number]  HPI   Review of Systems     Objective:   Physical Exam  Pulmonary/Chest: Right breast exhibits inverted nipple. Right breast exhibits no mass, no nipple discharge, no skin change and no tenderness. Left breast exhibits inverted nipple. Left breast exhibits no mass, no nipple discharge, no skin change and no tenderness. Breasts are symmetrical.  Bilateral inverted nipples.  Patient states is normal for her.  Abdominal: There is no splenomegaly or hepatomegaly.  Genitourinary: No labial fusion. There is no rash, tenderness, lesion or injury on the right labia. There is no rash, tenderness, lesion or injury on the left labia. Uterus is not deviated, not enlarged, not fixed and not tender. Cervix exhibits friability. Cervix exhibits no motion tenderness and no discharge. Right adnexum displays no mass, no tenderness and no fullness. Left adnexum displays no mass, no tenderness and no fullness. No erythema, tenderness or bleeding in the vagina. No foreign body around the vagina. No signs of injury around the vagina. No vaginal discharge found.         Assessment:     50 year [facility] Hispanic [facility]  Lloyda, the interpreter present during the interview and exam.  Clinical breast exam unremarkable.  Taught self breast awareness.  Specimen collected for pap smear without difficulty.  Dark blood filled polyp like cervical lesion noted on visual inspection.  Patient has been screened for eligibility.  She does not have any insurance, Medicare or Medicaid.  She also meets financial eligibility.  Hand-out given on the Affordable Care Act.     Plan:     Screening mammogram ordered.  Specimen for pap sent to the lab.  [facility] of the polyp like lesion.  Will follow-up per protocol.

## 2015-12-11 NOTE — Patient Instructions (Signed)
Gave patient hand-out, Women Staying Healthy, Active and Well from [facility], with education on breast health, pap smears, heart and colon health. 

## 2015-12-13 LAB — PAP LB AND HPV HIGH-RISK
HPV, high-risk: NEGATIVE
PAP Smear Comment: 0

## 2016-01-09 ENCOUNTER — Encounter: Payer: Self-pay | Admitting: Obstetrics and Gynecology

## 2016-01-09 ENCOUNTER — Ambulatory Visit (INDEPENDENT_AMBULATORY_CARE_PROVIDER_SITE_OTHER): Payer: MEDICAID | Admitting: Obstetrics and Gynecology

## 2016-01-09 ENCOUNTER — Other Ambulatory Visit: Payer: Self-pay | Admitting: Obstetrics and Gynecology

## 2016-01-09 VITALS — BP 121/73 | HR 75 | Ht 62.0 in | Wt 170.4 lb

## 2016-01-09 DIAGNOSIS — N841 Polyp of cervix uteri: Secondary | ICD-10-CM

## 2016-01-09 NOTE — Progress Notes (Signed)
GYN ENCOUNTER NOTE  Subjective:       Heather Branch is a 50 y.o. [phone] female is here for gynecologic evaluation of the following issues:  1. Cervical polyp.     No history of abnormal uterine bleeding. No pelvic pain. No history of severe dysmenorrhea or dyspareunia. No history of abnormal Pap smears. Cervical polyp was identified on routine exam. Gynecologic History Patient's last menstrual period was 12/10/2005 (lmp unknown). Contraception: Depo-Provera injections Last Pap: Normal Last mammogram: Not assessed  Obstetric History OB History  Gravida Para Term Preterm AB SAB TAB Ectopic Multiple Living  2 2 2       2     # Outcome Date GA Lbr Len/2nd Weight Sex Delivery Anes PTL Lv  2 Term 2007   7 lb 1.8 oz (3.225 kg) M Vag-Spont   Y  1 Term 2002   8 lb 1.8 oz (3.679 kg) F Vag-Spont   Y      Past Medical History  Diagnosis Date  . Gastritis   . Cholelithiasis     Past Surgical History  Procedure Laterality Date  . Other surgical history    . Cholecystectomy N/A 04/22/2015    Procedure: LAPAROSCOPIC CHOLECYSTECTOMY WITH INTRAOPERATIVE CHOLANGIOGRAM;  Surgeon: Ricarda Frameharles Woodham, MD;  Location: [facility] ORS;  Service: General;  Laterality: N/A;    No current outpatient prescriptions on file prior to visit.   No current facility-administered medications on file prior to visit.    No Known Allergies  Social History   Social History  . Marital Status: Single    Spouse Name: N/A  . Number of Children: N/A  . Years of Education: N/A   Occupational History  . Not on file.   Social History Main Topics  . Smoking status: Never Smoker   . Smokeless tobacco: Never Used  . Alcohol Use: No  . Drug Use: No  . Sexual Activity: Yes    Birth Control/ Protection: Injection   Other Topics Concern  . Not on file   Social History Narrative    Family History  Problem Relation Age of Onset  . Diabetes Mellitus II Father   . Heart disease Father   . Kidney disease  Father     The following portions of the patient's history were reviewed and updated as appropriate: allergies, current medications, past family history, past medical history, past social history, past surgical history and problem list.  Review of Systems Review of Systems - per history of present illness Review of Systems - General ROS: negative for - chills, fatigue, fever, hot flashes, malaise or night sweats Hematological and Lymphatic ROS: negative for - bleeding problems or swollen lymph nodes Gastrointestinal ROS: negative for - abdominal pain, blood in stools, change in bowel habits and nausea/vomiting Genito-Urinary ROS: negative for - change in menstrual cycle, dysmenorrhea, dyspareunia, dysuria, genital discharge, genital ulcers, hematuria, incontinence, irregular/heavy menses, nocturia or pelvic pain  Objective:   BP 121/73 mmHg  Pulse 75  Ht 5\' 2"  (1.575 m)  Wt 170 lb 6.4 oz (77.293 kg)  BMI 31.16 kg/m2  LMP 12/10/2005 (LMP Unknown) CONSTITUTIONAL: Well-developed, well-nourished female in no acute distress.  HENT:  Normocephalic, atraumatic.  NECK:Not examined SKIN: Skin is warm and dry. No rash noted. Not diaphoretic. No erythema. No pallor. NEUROLGIC: Alert and oriented to person, place, and time. PSYCHIATRIC: Normal mood and affect. Normal behavior. Normal judgment and thought content. CARDIOVASCULAR:Not Examined RESPIRATORY: Not Examined BREASTS: Not Examined ABDOMEN: Soft, non distended; Non tender.  No Organomegaly. PELVIC:  External Genitalia: Normal  BUS: Normal  Vagina: Normal  Cervix: 2 cm cervical polyp with 7 mm stopped attached at the 8:00 position; the polyp contains a black cystic lesion consistent with possible hemorrhage or endometriosis  Uterus: Not examined  Adnexa: Not examined  RV: Normal external exam  Bladder: Nontender MUSCULOSKELETAL: Normal range of motion. No tenderness.  No cyanosis, clubbing, or edema.   PROCEDURE: Cervical  biopsy Consent is obtained. The polyp is removed with Tischler biopsy forceps cutting across the base of the polyp. Monsel solution is applied for hemostasis. Bleeding-minimal. Procedure was well-tolerated. Specimen was sent to pathology   Assessment:   Cervical polyp-2 cm Plan:   1. Cervical biopsy as noted 2. Post biopsy instructions given 3. Patient will be notified of biopsy results when available  A total of 20 minutes were spent face-to-face with the patient during this encounter and over half of that time dealt with counseling and coordination of care.  Herold Harms, MD  Note: This dictation was prepared with Dragon dictation along with smaller phrase technology. Any transcriptional errors that result from this process are unintentional.

## 2016-01-09 NOTE — Patient Instructions (Signed)
Biopsia cervicouterina, cuidados posteriores (Cervical Biopsy, Care After) Siga estas instrucciones durante las prximas semanas. Estas indicaciones le proporcionan informacin acerca de cmo deber cuidarse despus del procedimiento. El mdico tambin podr darle instrucciones ms especficas. El tratamiento ha sido planificado segn las prcticas mdicas actuales, pero en algunos casos pueden ocurrir problemas. Comunquese con el mdico si tiene algn problema o dudas despus del procedimiento. QU ESPERAR DESPUS DEL PROCEDIMIENTO Despus del procedimiento, es comn DIRECTVtener los siguientes sntomas:   [place] abdominales o dolor leve durante [address].  Hemorragia vaginal leve durante algunos das.  Secrecin vaginal oscura durante Time Warneralgunos das. INSTRUCCIONES PARA EL CUIDADO EN EL HOGAR  Tome los medicamentos de venta libre y los recetados solamente como se lo haya indicado el mdico.  Reanude sus actividades normales como se lo haya indicado el mdico. Pregntele al mdico qu actividades son seguras para usted.  Use una compresa higinica hasta que la hemorragia y la secrecin se detengan.  No use tampones hasta que el mdico la autorice.  No se haga duchas vaginales hasta que el mdico la autorice.  No tenga relaciones sexuales hasta que el mdico la autorice.  Concurra a todas las visitas de control como se lo haya indicado el mdico. Esto es importante. SOLICITE ATENCIN MDICA SI:   Tiene fiebre o siente escalofros.  Tiene secrecin vaginal con mal olor.  Tiene picazn o irritacin alrededor de la vagina.  Siente dolor en la parte inferior del abdomen. SOLICITE ATENCIN MDICA DE INMEDIATO SI:   Tiene hemorragia vaginal abundante que empapa ms de una compresa higinica por hora.  Se desmaya.  Siente un dolor muy intenso en la parte inferior del abdomen.   Esta informacin no tiene Theme park managercomo fin reemplazar el consejo del mdico. Asegrese de hacerle al mdico  cualquier pregunta que tenga.   Document Released: 03/06/2015 Elsevier Interactive Patient Education [employer]2016 Elsevier Inc.

## 2016-01-13 LAB — PATHOLOGY

## 2016-01-16 ENCOUNTER — Encounter: Payer: Self-pay | Admitting: *Deleted

## 2016-01-16 NOTE — Progress Notes (Signed)
Benign cervical polyp on biopsy.  Patient to follow up with annual mammogram.  Next pap in 5 years.  [facility] [place].

## 2019-02-03 DIAGNOSIS — E669 Obesity, unspecified: Secondary | ICD-10-CM

## 2019-02-06 ENCOUNTER — Other Ambulatory Visit: Payer: Self-pay

## 2019-02-06 ENCOUNTER — Ambulatory Visit (LOCAL_COMMUNITY_HEALTH_CENTER): Payer: Self-pay

## 2019-02-06 VITALS — BP 104/64 | Ht 62.0 in | Wt 174.0 lb

## 2019-02-06 DIAGNOSIS — Z3009 Encounter for other general counseling and advice on contraception: Secondary | ICD-10-CM

## 2019-02-06 DIAGNOSIS — Z30013 Encounter for initial prescription of injectable contraceptive: Secondary | ICD-10-CM

## 2019-02-06 MED ORDER — MEDROXYPROGESTERONE ACETATE 150 MG/ML IM SUSP
150.0000 mg | Freq: Once | INTRAMUSCULAR | Status: AC
  Administered 2019-02-06: 12:00:00 150 mg via INTRAMUSCULAR

## 2019-02-06 MED ORDER — MULTI-VITAMIN/MINERALS PO TABS: 1.0000 | ORAL_TABLET | Freq: Every day | ORAL | 0 refills | Status: DC

## 2019-02-06 NOTE — Addendum Note (Signed)
Addended by: Rich Number on: 02/06/2019 04:59 PM   Modules accepted: Level of Service

## 2019-02-06 NOTE — Progress Notes (Signed)
Depo administered without difficulty per standing order of Dr. Lauretta Chester. Client tolerated without difficulty. Shona Needles, RN

## 2019-04-25 ENCOUNTER — Ambulatory Visit (LOCAL_COMMUNITY_HEALTH_CENTER): Payer: Self-pay

## 2019-04-25 ENCOUNTER — Other Ambulatory Visit: Payer: Self-pay

## 2019-04-25 VITALS — BP 116/75 | Ht 62.0 in | Wt 175.0 lb

## 2019-04-25 DIAGNOSIS — Z3009 Encounter for other general counseling and advice on contraception: Secondary | ICD-10-CM

## 2019-04-25 DIAGNOSIS — Z30013 Encounter for initial prescription of injectable contraceptive: Secondary | ICD-10-CM

## 2019-04-25 MED ORDER — MEDROXYPROGESTERONE ACETATE 150 MG/ML IM SUSP
150.0000 mg | Freq: Once | INTRAMUSCULAR | Status: AC
  Administered 2019-04-25: 09:00:00 150 mg via INTRAMUSCULAR

## 2019-04-25 NOTE — Progress Notes (Signed)
Pt here for Depo. Pt's last Depo was 02/06/2019 so pt is 11 weeks and 1 day post last Depo. Last physical was 12/23/2017. Pt without any issues with Depo and denies any side effects. Consulted with Antoine Primas, PA as pt does not have a current order for Depo and per Antoine Primas, PA verbal order ok to pt to get Depo 150mg  IM today and then in 11-13 weeks she will need to come back for a women's health revisit so a provider can review her history and do a CBE. Pt received Depo 150mg  IM today per Antoine Primas, PA order. Pt tolerated well. Instructed pt her Antoine Primas, [place] verbal order and pt states understanding.Ronny Bacon, RN

## 2019-07-18 ENCOUNTER — Ambulatory Visit: Payer: Self-pay

## 2019-07-18 ENCOUNTER — Encounter: Payer: Self-pay | Admitting: Family Medicine

## 2019-07-18 ENCOUNTER — Other Ambulatory Visit: Payer: Self-pay

## 2019-07-18 ENCOUNTER — Ambulatory Visit (LOCAL_COMMUNITY_HEALTH_CENTER): Payer: Self-pay | Admitting: Family Medicine

## 2019-07-18 VITALS — BP 115/65 | Ht 62.0 in | Wt 172.3 lb

## 2019-07-18 DIAGNOSIS — Z3042 Encounter for surveillance of injectable contraceptive: Secondary | ICD-10-CM

## 2019-07-18 DIAGNOSIS — Z30013 Encounter for initial prescription of injectable contraceptive: Secondary | ICD-10-CM

## 2019-07-18 DIAGNOSIS — Z3009 Encounter for other general counseling and advice on contraception: Secondary | ICD-10-CM

## 2019-07-18 MED ORDER — THERA VITAL M PO TABS: 1.0000 | ORAL_TABLET | Freq: Every day | ORAL | 3 refills | Status: AC

## 2019-07-18 MED ORDER — MEDROXYPROGESTERONE ACETATE 150 MG/ML IM SUSP
150.0000 mg | INTRAMUSCULAR | Status: AC
  Administered 2019-07-18: 11:00:00 150 mg via INTRAMUSCULAR

## 2019-07-18 NOTE — Progress Notes (Signed)
Family Planning Visit  Subjective:  Heather Branch is a 54 y.o. being seen today for  Chief Complaint  Patient presents with  . Contraception    Pt has Gastritis; Cervical polyp; and Obesity, unspecified on their problem list.  HPI  Patient reports she is here for Depo, has been using this x several years, does not get a period. Unsure if she has gone through menopause. States 6 months ago she got episodes of sweating/hot flashes x2 months, now resolved.   Due for CBE today. Has noticed no abnormalities w/breasts. Last mammogram 2 yrs ago, due for another.   Next pap due 2022.  Patient reports 1 partner(s) in last year. Do they desire STI screening (if no, why not)? no  Does the patient desire a pregnancy in the next year? no   54 y.o., Body mass index is 31.51 kg/m. - Is patient eligible for HA1C diabetes screening based on BMI and age >70?  Yes, though pt declines  Does the patient have a current or past history of drug use? No No components found for: HCV  See flowsheet for other program required questions.   Health Maintenance Due  Topic Date Due  . TETANUS/TDAP  02/19/1986  . COLONOSCOPY  02/19/2017  . MAMMOGRAM  12/10/2017    ROS  The following portions of the patient's history were reviewed and updated as appropriate: allergies, current medications, past family history, past medical history, past social history, past surgical history and problem list. Problem list updated.  Objective:  BP 115/65   Ht 5\' 2"  (1.575 m)   Wt 172 lb 4.8 oz (78.2 kg)   BMI 31.51 kg/m    Physical Exam  Gen: well appearing, NAD HEENT: no scleral icterus CV: RR, no murmurs Lung: Normal WOB, CTAB Breasts:        Right: Normal. No swelling, mass, nipple discharge, skin change or tenderness. +inverted nipple (x entire life per pt)       Left: Normal. No swelling, mass, nipple discharge, skin change or tenderness.  +inverted nipple (x entire life per pt) Ext: warm well  perfused      Assessment and Plan:  Heather Branch is a 54 y.o. female presenting to the [facility] Department for a well woman exam/family planning visit  Contraception counseling: Reviewed all forms of birth control options in the tiered based approach including abstinence; over the counter/barrier methods; hormonal contraceptive medication including pill, patch, ring, injection, contraceptive implant; hormonal and nonhormonal IUDs; permanent sterilization options including vasectomy and the various tubal sterilization modalities. Risks, benefits, how to discontinue and typical effectiveness rates were reviewed.  Questions were answered.  Written information was also given to the patient to review.  Patient desires depo, this was prescribed for patient. She will follow up in  3 months for surveillance.  She was told to call with any further questions, or with any concerns about this method of contraception.  Emphasized use of condoms 100% of the time for STI prevention.    1. Encounter for surveillance of injectable contraceptive -D/t pt's age and recent symptoms they have likely gone/are going through menopause, though as she is on Depo we cannot tell by her menstrual status. I advised her to make an appt with her PCP for a workup to see if she has gone through menopause and therefore doesn't need depo.  -In the meantime will rx Depo x1 yr in case she has not completed menopause. Counseling as above, including risks to  bone mineral density - advised to continue MV and wt bearing exercise. - medroxyPROGESTERone (DEPO-PROVERA) injection 150 mg  2. Family planning services -No complete physical today d/t Covid, though breast exam done today and is normal. Pt is due for mammogram, has been seen by BCCCP before, states she will call to schedule repeat soon. -Pt qualifies for A1C check today, she declines this, states she will do at upcoming outside doctor's appt. -Pap due in 1 yr  per RN review of prior EMR.   Return in about 3 months (around 10/16/2019) for Depo if PCP determines she is not postmenopausal.  No future appointments.  Ann Held, PA-C

## 2019-07-18 NOTE — Progress Notes (Signed)
In for Depo; Primary care [facility], [place]

## 2019-09-18 ENCOUNTER — Ambulatory Visit: Payer: Self-pay | Attending: Internal Medicine

## 2019-09-18 DIAGNOSIS — Z23 Encounter for immunization: Secondary | ICD-10-CM

## 2019-09-18 NOTE — Progress Notes (Signed)
   Covid-19 Vaccination Clinic  Name:  Heather Branch    MRN: [id number] DOB: 1966-04-17  09/18/2019  Heather Branch was observed post Covid-19 immunization for 15 minutes without incident. She was provided with Vaccine Information Sheet and instruction to access the V-Safe system.   Heather Branch was instructed to call 911 with any severe reactions post vaccine: Marland Kitchen Difficulty breathing  . Swelling of face and throat  . A fast heartbeat  . A bad rash all over body  . Dizziness and weakness   Immunizations Administered    Name Date Dose VIS Date Route   Pfizer COVID-19 Vaccine 09/18/2019  5:31 PM 0.3 mL 06/09/2019 Intramuscular   Manufacturer: [employer], [employer]   Lot: [id number]   NDC: [address]-4

## 2019-10-09 ENCOUNTER — Ambulatory Visit: Payer: Self-pay | Attending: Internal Medicine

## 2019-10-09 DIAGNOSIS — Z23 Encounter for immunization: Secondary | ICD-10-CM

## 2019-10-09 NOTE — Progress Notes (Signed)
   Covid-19 Vaccination Clinic  Name:  Heather Branch    MRN: [id number] DOB: May 23, 1966  10/09/2019  [name] was observed post Covid-19 immunization for 15 minutes without incident. She was provided with Vaccine Information Sheet and instruction to access the V-Safe system. Medical Interpreter used.  [name] was instructed to call 911 with any severe reactions post vaccine: Marland Kitchen Difficulty breathing  . Swelling of face and throat  . A fast heartbeat  . A bad rash all over body  . Dizziness and weakness   Immunizations Administered    Name Date Dose VIS Date Route   Pfizer COVID-19 Vaccine 10/09/2019  4:55 PM 0.3 mL 06/09/2019 Intramuscular   Manufacturer: [employer], [employer]   Lot: [id number]   NDC: [address]-3

## 2020-05-13 ENCOUNTER — Ambulatory Visit: Payer: Self-pay | Attending: Internal Medicine

## 2020-05-13 DIAGNOSIS — Z23 Encounter for immunization: Secondary | ICD-10-CM

## 2020-05-13 NOTE — Progress Notes (Signed)
   Covid-19 Vaccination Clinic  Name:  Keylin Ferryman    MRN: [id number] DOB: 1966/05/04  05/13/2020  Ms. Jaidah Lomax was observed post Covid-19 immunization for 15 minutes without incident. She was provided with Vaccine Information Sheet and instruction to access the V-Safe system.   Ms. Klee Kolek was instructed to call 911 with any severe reactions post vaccine: Marland Kitchen Difficulty breathing  . Swelling of face and throat  . A fast heartbeat  . A bad rash all over body  . Dizziness and weakness   Immunizations Administered    Name Date Dose VIS Date Route   Pfizer COVID-19 Vaccine 05/13/2020  1:36 PM 0.3 mL 04/17/2020 Intramuscular   Manufacturer: [employer], [employer]   Lot: [id number]   NDC: [address]-4

## 2020-07-10 ENCOUNTER — Other Ambulatory Visit: Payer: Self-pay

## 2020-07-10 DIAGNOSIS — Z20822 Contact with and (suspected) exposure to covid-19: Secondary | ICD-10-CM

## 2020-07-12 LAB — SARS-COV-2, NAA 2 DAY TAT

## 2020-07-12 LAB — NOVEL CORONAVIRUS, NAA: SARS-CoV-2, NAA: NOT DETECTED

## 2021-08-18 ENCOUNTER — Other Ambulatory Visit: Payer: Self-pay | Admitting: Chiropractor

## 2021-08-18 ENCOUNTER — Ambulatory Visit
Admission: RE | Admit: 2021-08-18 | Discharge: 2021-08-18 | Disposition: A | Discharge status: Home / Self Care | Payer: Self-pay | Source: Ambulatory Visit | Attending: Chiropractor | Admitting: Chiropractor

## 2021-08-18 ENCOUNTER — Ambulatory Visit
Admission: RE | Admit: 2021-08-18 | Discharge: 2021-08-18 | Disposition: A | Discharge status: Home / Self Care | Payer: Self-pay | Source: Ambulatory Visit | Attending: Hematology and Oncology | Admitting: Hematology and Oncology

## 2021-08-18 DIAGNOSIS — S233XXA Sprain of ligaments of thoracic spine, initial encounter: Secondary | ICD-10-CM

## 2021-08-18 DIAGNOSIS — S338XXA Sprain of other parts of lumbar spine and pelvis, initial encounter: Secondary | ICD-10-CM

## 2021-08-18 DIAGNOSIS — S134XXA Sprain of ligaments of cervical spine, initial encounter: Secondary | ICD-10-CM

## 2021-08-20 ENCOUNTER — Other Ambulatory Visit: Payer: Self-pay | Admitting: Chiropractor

## 2021-08-20 ENCOUNTER — Ambulatory Visit
Admission: RE | Admit: 2021-08-20 | Discharge: 2021-08-20 | Disposition: A | Discharge status: Home / Self Care | Payer: Self-pay | Source: Ambulatory Visit | Attending: Hematology and Oncology | Admitting: Hematology and Oncology

## 2021-08-20 ENCOUNTER — Ambulatory Visit
Admission: RE | Admit: 2021-08-20 | Discharge: 2021-08-20 | Disposition: A | Discharge status: Home / Self Care | Payer: Self-pay | Source: Ambulatory Visit | Attending: Chiropractor | Admitting: Chiropractor

## 2021-08-20 DIAGNOSIS — S2239XB Fracture of one rib, unspecified side, initial encounter for open fracture: Secondary | ICD-10-CM

## 2021-08-20 DIAGNOSIS — R2231 Localized swelling, mass and lump, right upper limb: Secondary | ICD-10-CM

## 2022-01-24 ENCOUNTER — Other Ambulatory Visit: Payer: Self-pay | Admitting: *Deleted

## 2022-01-24 DIAGNOSIS — Z1231 Encounter for screening mammogram for malignant neoplasm of breast: Secondary | ICD-10-CM

## 2022-02-11 ENCOUNTER — Ambulatory Visit: Payer: Self-pay

## 2022-02-27 ENCOUNTER — Ambulatory Visit: Payer: Self-pay | Attending: Hematology and Oncology | Admitting: Hematology and Oncology

## 2022-02-27 ENCOUNTER — Ambulatory Visit
Admission: RE | Admit: 2022-02-27 | Discharge: 2022-02-27 | Disposition: A | Discharge status: Home / Self Care | Payer: Self-pay | Source: Ambulatory Visit | Attending: Obstetrics and Gynecology | Admitting: Obstetrics and Gynecology

## 2022-02-27 DIAGNOSIS — Z1231 Encounter for screening mammogram for malignant neoplasm of breast: Secondary | ICD-10-CM | POA: Insufficient documentation

## 2022-02-27 NOTE — Progress Notes (Signed)
Ms. Heather Branch is a 56 y.o. female who presents to [facility] clinic today with no complaints .    Pap Smear: Pap not smear completed today. Last Pap smear was 2021 at [facility] clinic and was normal. Per patient has history of an abnormal Pap smear. Last Pap smear result is not available in Epic.   Physical exam: Breasts Breasts symmetrical. No skin abnormalities bilateral breasts. No nipple retraction bilateral breasts. No nipple discharge bilateral breasts. No lymphadenopathy. No lumps palpated bilateral breasts.        Pelvic/Bimanual Pap is not indicated today    Smoking History: Patient has never smoked and was not referred to quit line.    Patient Navigation: Patient education provided. Access to services provided for patient through BCCCP program. [name] interpreter provided. No transportation provided   Colorectal Cancer Screening: Per patient has never had colonoscopy completed No complaints today. FIT test negative 2023   Breast and Cervical Cancer Risk Assessment: Patient does not have family history of breast cancer, known genetic mutations, or radiation treatment to the chest before age 80. Patient does not have history of cervical dysplasia, immunocompromised, or DES exposure in-utero.  Risk Assessment     Risk Scores       02/27/2022   Last edited by: Narda Rutherford, LPN   5-year risk: 1 %   Lifetime risk: 7.2 %            A: BCCCP exam without pap smear. Benign exam. Sent for screening exam.  P: Referred [facility] for a screening mammogram. Appointment scheduled 02/27/2022.  Ilda Basset A, NP 02/27/2022 10:20 AM

## 2022-03-03 ENCOUNTER — Other Ambulatory Visit: Payer: Self-pay | Admitting: *Deleted

## 2022-03-03 ENCOUNTER — Inpatient Hospital Stay
Admission: RE | Admit: 2022-03-03 | Discharge: 2022-03-03 | Disposition: A | Discharge status: Home / Self Care | Payer: Self-pay | Source: Ambulatory Visit | Attending: *Deleted | Admitting: *Deleted

## 2022-03-03 ENCOUNTER — Other Ambulatory Visit: Payer: Self-pay | Admitting: Obstetrics and Gynecology

## 2022-03-03 DIAGNOSIS — Z1231 Encounter for screening mammogram for malignant neoplasm of breast: Secondary | ICD-10-CM

## 2022-03-03 DIAGNOSIS — N6489 Other specified disorders of breast: Secondary | ICD-10-CM

## 2022-03-03 DIAGNOSIS — R928 Other abnormal and inconclusive findings on diagnostic imaging of breast: Secondary | ICD-10-CM

## 2022-03-19 ENCOUNTER — Ambulatory Visit
Admission: RE | Admit: 2022-03-19 | Discharge: 2022-03-19 | Disposition: A | Discharge status: Home / Self Care | Payer: Self-pay | Source: Ambulatory Visit | Attending: Obstetrics and Gynecology | Admitting: Obstetrics and Gynecology

## 2022-03-19 DIAGNOSIS — N6489 Other specified disorders of breast: Secondary | ICD-10-CM | POA: Insufficient documentation

## 2022-03-19 DIAGNOSIS — R928 Other abnormal and inconclusive findings on diagnostic imaging of breast: Secondary | ICD-10-CM | POA: Insufficient documentation

## 2022-03-20 ENCOUNTER — Other Ambulatory Visit: Payer: Self-pay | Admitting: Obstetrics and Gynecology

## 2022-03-20 DIAGNOSIS — N6489 Other specified disorders of breast: Secondary | ICD-10-CM

## 2022-03-20 DIAGNOSIS — R928 Other abnormal and inconclusive findings on diagnostic imaging of breast: Secondary | ICD-10-CM

## 2022-04-06 ENCOUNTER — Ambulatory Visit
Admission: RE | Admit: 2022-04-06 | Discharge: 2022-04-06 | Disposition: A | Discharge status: Home / Self Care | Payer: Self-pay | Source: Ambulatory Visit | Attending: Obstetrics and Gynecology | Admitting: Obstetrics and Gynecology

## 2022-04-06 DIAGNOSIS — N6489 Other specified disorders of breast: Secondary | ICD-10-CM

## 2022-04-06 DIAGNOSIS — R928 Other abnormal and inconclusive findings on diagnostic imaging of breast: Secondary | ICD-10-CM

## 2022-04-06 HISTORY — PX: BREAST BIOPSY: SHX20

## 2022-04-07 LAB — SURGICAL PATHOLOGY

## 2022-04-22 ENCOUNTER — Ambulatory Visit
Admission: RE | Admit: 2022-04-22 | Discharge: 2022-04-22 | Disposition: A | Discharge status: Home / Self Care | Payer: Self-pay | Attending: Nurse Practitioner | Admitting: Nurse Practitioner

## 2022-04-22 ENCOUNTER — Other Ambulatory Visit: Payer: Self-pay | Admitting: Nurse Practitioner

## 2022-04-22 ENCOUNTER — Ambulatory Visit
Admission: RE | Admit: 2022-04-22 | Discharge: 2022-04-22 | Disposition: A | Discharge status: Home / Self Care | Payer: Self-pay | Source: Ambulatory Visit | Attending: Nurse Practitioner | Admitting: Nurse Practitioner

## 2022-04-22 DIAGNOSIS — M25649 Stiffness of unspecified hand, not elsewhere classified: Secondary | ICD-10-CM

## 2022-10-07 IMAGING — CR DG CHEST 2V
1 series · 2 of 2 positions shown · non-contrast
Comparison: None.

CLINICAL DATA: Sharp chest pain

EXAM:
CHEST - 2 VIEW

[Series 1: dg chest 2 view · 0.14mm/px · 2 of 2 slices shown]
[im 1/2]
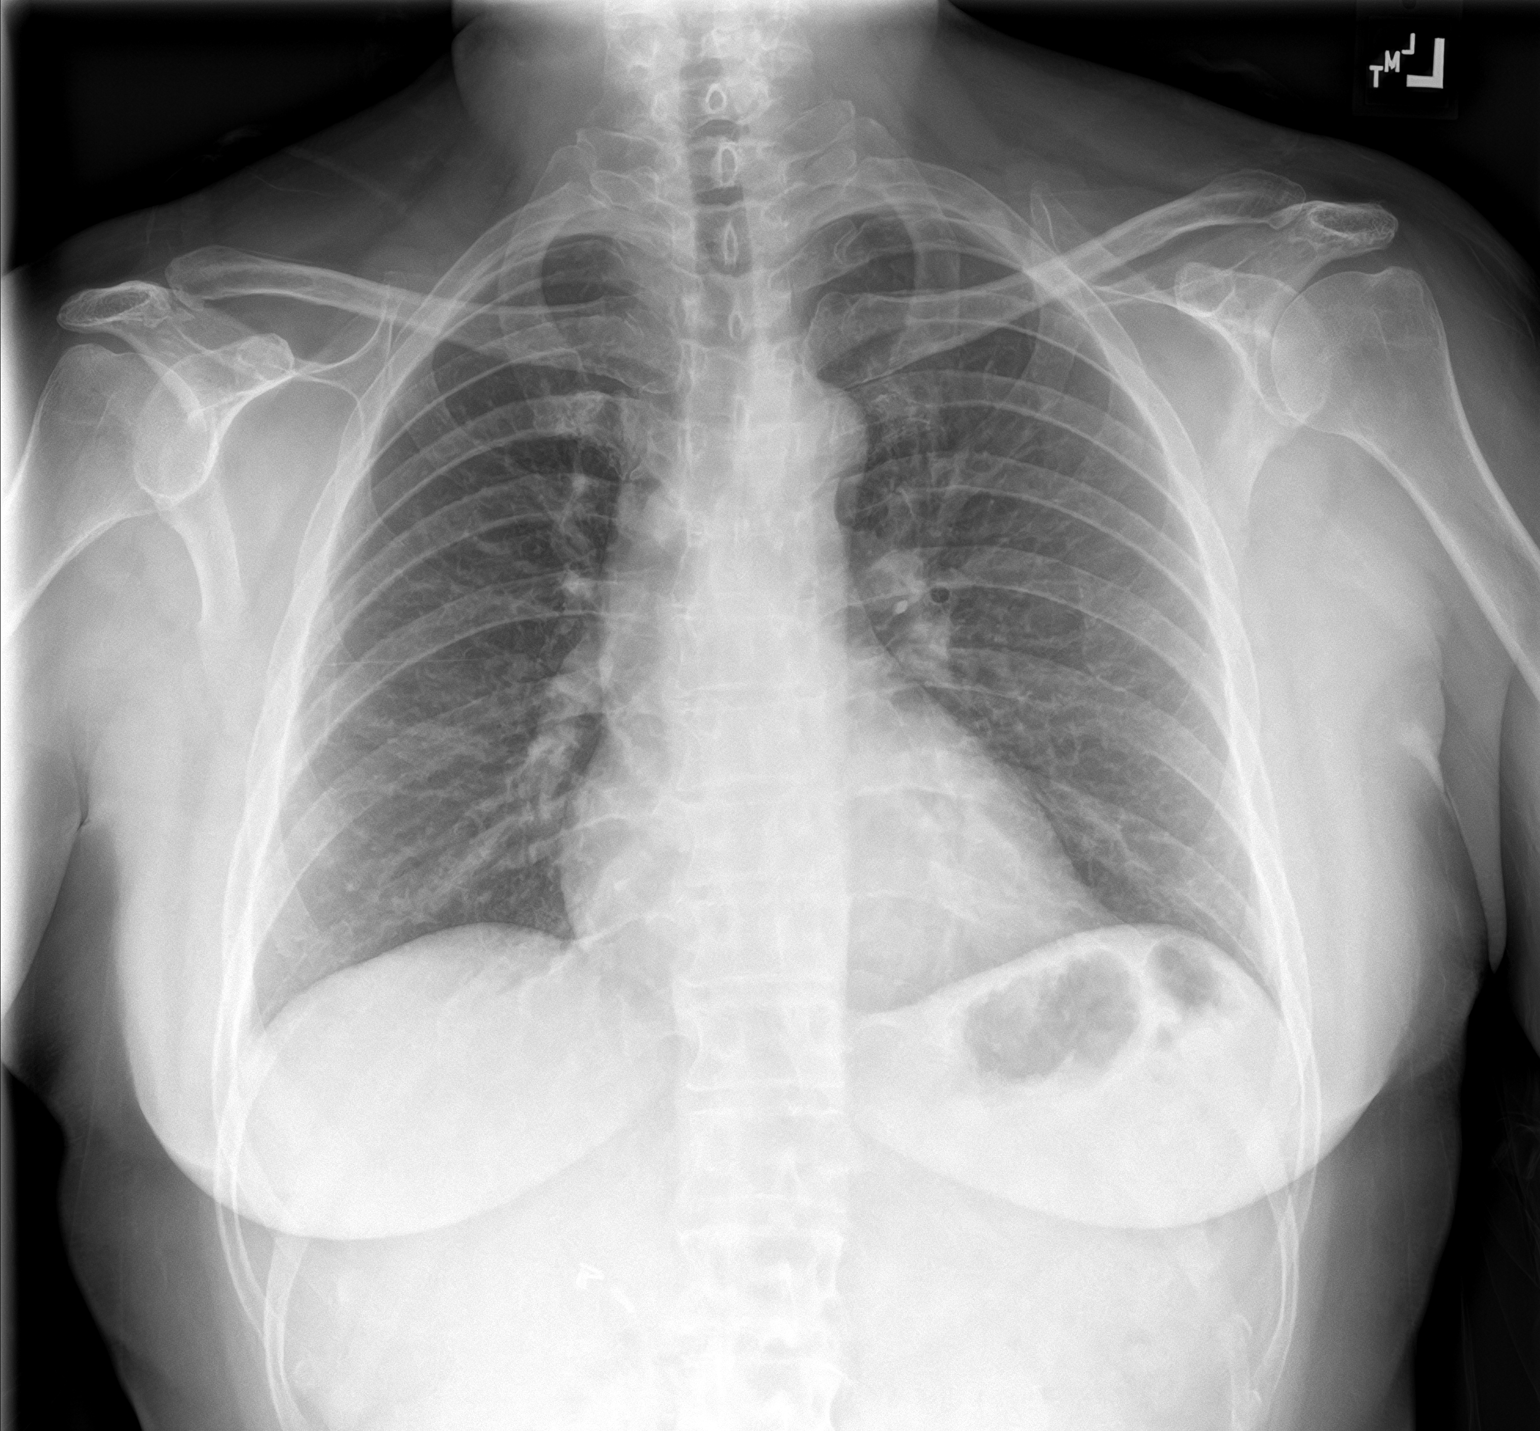
[im 2/2]
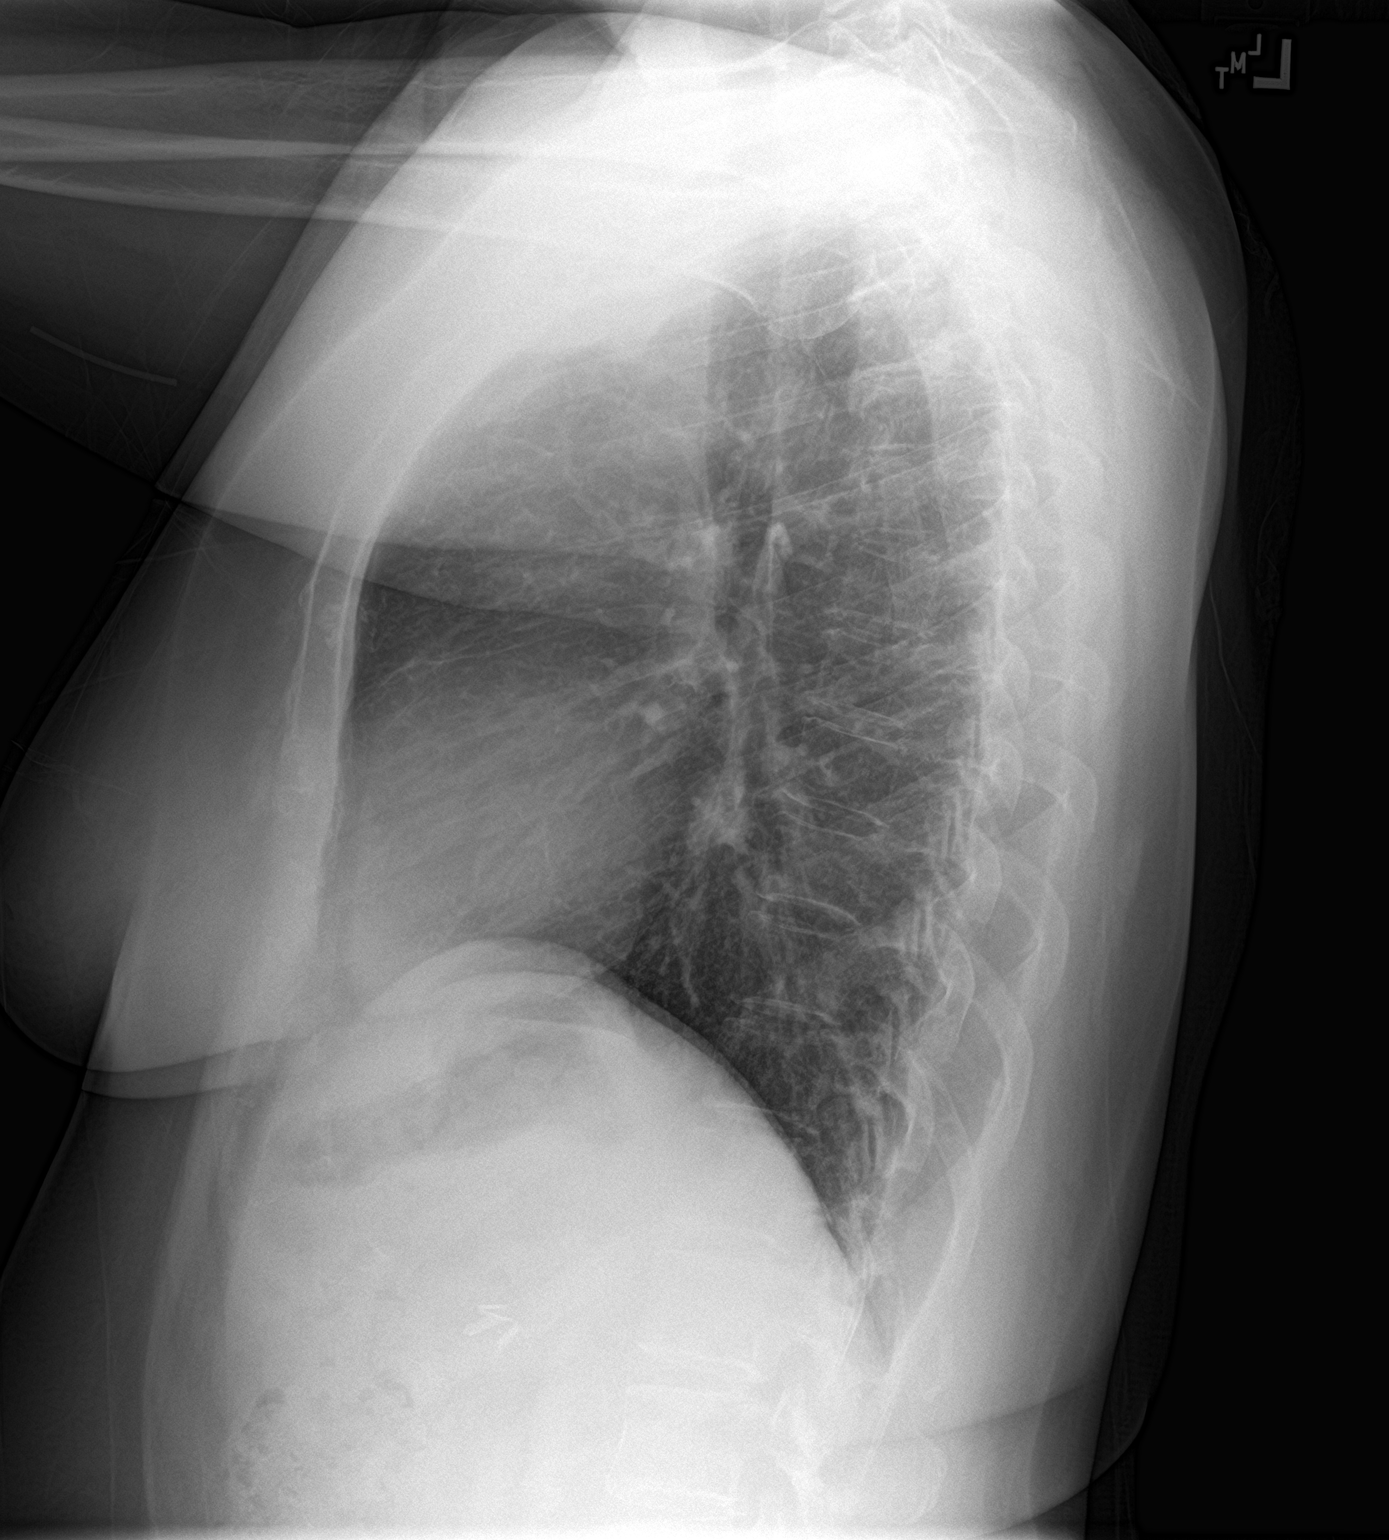

[2 of 2 positions shown; findings below may reference images not displayed]

FINDINGS: The heart size and mediastinal contours are within normal limits.
Both lungs are clear. The visualized skeletal structures are
unremarkable.
IMPRESSION: No active cardiopulmonary disease.

## 2023-05-24 ENCOUNTER — Telehealth: Payer: Self-pay | Admitting: *Deleted

## 2023-07-06 ENCOUNTER — Telehealth: Payer: Self-pay | Admitting: *Deleted

## 2023-07-10 ENCOUNTER — Emergency Department: Payer: Self-pay

## 2023-07-10 DIAGNOSIS — S82301A Unspecified fracture of lower end of right tibia, initial encounter for closed fracture: Secondary | ICD-10-CM | POA: Insufficient documentation

## 2023-07-10 DIAGNOSIS — W010XXA Fall on same level from slipping, tripping and stumbling without subsequent striking against object, initial encounter: Secondary | ICD-10-CM | POA: Insufficient documentation

## 2023-07-10 DIAGNOSIS — S82831A Other fracture of upper and lower end of right fibula, initial encounter for closed fracture: Secondary | ICD-10-CM | POA: Insufficient documentation

## 2023-07-10 NOTE — ED Triage Notes (Signed)
 Pt was visiting a family member and rolled her R ankle on the ice outside, swelling noted.

## 2023-07-11 ENCOUNTER — Emergency Department
Admission: EM | Admit: 2023-07-11 | Discharge: 2023-07-11 | Disposition: A | Discharge status: Home / Self Care | Payer: Self-pay | Attending: Emergency Medicine | Admitting: Emergency Medicine

## 2023-07-11 DIAGNOSIS — S82301A Unspecified fracture of lower end of right tibia, initial encounter for closed fracture: Secondary | ICD-10-CM

## 2023-07-11 DIAGNOSIS — S82831A Other fracture of upper and lower end of right fibula, initial encounter for closed fracture: Secondary | ICD-10-CM

## 2023-07-11 MED ORDER — OXYCODONE HCL 5 MG PO TABS: 5.0000 mg | ORAL_TABLET | Freq: Four times a day (QID) | ORAL | 0 refills | Status: AC | PRN

## 2023-07-11 MED ORDER — OXYCODONE-ACETAMINOPHEN 5-325 MG PO TABS
1.0000 | ORAL_TABLET | Freq: Once | ORAL | Status: AC
  Administered 2023-07-11: 1 via ORAL
  Filled 2023-07-11: qty 1

## 2023-07-11 NOTE — Discharge Instructions (Addendum)
 Call the orthopedic doctor to make a follow-up appointment and return to the ER if you develop worsening symptoms or any other concerns. Do not bear any weight.  Use crutches.  Take Tylenol  1 g every 8 hours and use oxycodone  for breakthrough pain.   IMPRESSION: Distal tibial and fibular fractures, as above.  Take oxycodone  as prescribed. Do not drink alcohol, drive or participate in any other potentially dangerous activities while taking this medication as it may make you sleepy. Do not take this medication with any other sedating medications, either prescription or over-the-counter. If you were prescribed Percocet or Vicodin, do not take these with acetaminophen  (Tylenol ) as it is already contained within these medications.  This medication is an opiate (or narcotic) pain medication and can be habit forming. Use it as little as possible to achieve adequate pain control. Do not use or use it with extreme caution if you have a history of opiate abuse or dependence. If you are on a pain contract with your primary care doctor or a pain specialist, be sure to let them know you were prescribed this medication today from the Emergency Department. This medication is intended for your use only - do not give any to anyone else and keep it in a secure place where nobody else, especially children, have access to it.

## 2023-07-11 NOTE — ED Provider Notes (Signed)
 [facility] Provider Note    Event Date/Time   First MD Initiated Contact with Patient 07/11/23 [id number]     (approximate)   History   Ankle Pain   HPI  Heather Branch is a 58 y.o. female who is otherwise healthy not on any medication who comes in for rolling her right ankle.  Patient was walking and slipped on the ice when family caught her.  Did not hit her head.  Denies any other pain other than right ankle pain.  Denies any bleeding.   Physical Exam   Triage Vital Signs: ED Triage Vitals  Encounter Vitals Group     BP 07/10/23 2152 (!) 153/69     Systolic BP Percentile --      Diastolic BP Percentile --      Pulse Rate 07/10/23 2152 89     Resp 07/10/23 2152 18     Temp 07/10/23 2152 98.7 F (37.1 C)     Temp Source 07/10/23 2152 Oral     SpO2 07/10/23 2152 99 %     Weight --      Height --      Head Circumference --      Peak Flow --      Pain Score 07/10/23 2151 9     Pain Loc --      Pain Education --      Exclude from Growth Chart --     Most recent vital signs: Vitals:   07/10/23 2152 07/11/23 0213  BP: (!) 153/69 135/75  Pulse: 89 73  Resp: 18 18  Temp: 98.7 F (37.1 C) 97.8 F (36.6 C)  SpO2: 99% 99%     General: Awake, no distress.  CV:  Good peripheral perfusion.  Resp:  Normal effort.  Abd:  No distention.  Other:  Good distal pulse.  Sensation intact.  Will to slightly flex and extend the ankle.  No knee pain no hip pain.  No chest wall pain abdominal pain no acute head hematoma no neck pain.   ED Results / Procedures / Treatments   Labs (all labs ordered are listed, but only abnormal results are displayed) Labs Reviewed - No data to display   EKG  My interpretation of EKG:    RADIOLOGY I have reviewed the xray personally and interpreted and positive distal tibial/fibular fracture    PROCEDURES:  Critical Care performed: No  Procedures   MEDICATIONS ORDERED IN ED: Medications   oxyCODONE -acetaminophen  (PERCOCET/ROXICET) 5-325 MG per tablet 1 tablet (1 tablet Oral Given 07/11/23 0214)     IMPRESSION / MDM / ASSESSMENT AND PLAN / ED COURSE  I reviewed the triage vital signs and the nursing notes.   Patient's presentation is most consistent with acute, uncomplicated illness.   Differential includes fracture, dislocation.  No other injuries.  IMPRESSION: Distal tibial and fibular fractures, as above.   Patient was placed in stirrup given orthopedics number for follow-up.  Instructed to be nonweightbearing with crutches oxycodone  for pain not to drive or work while on it  Pt declined interpreter and family preferred to interpret themselves even though I did instruct them that I could go get an interpreter stick.  D/w podiatry Dr malvin- recommend ortho f.u      FINAL CLINICAL IMPRESSION(S) / ED DIAGNOSES   Final diagnoses:  Closed fracture of distal end of right fibula, unspecified fracture morphology, initial encounter  Closed fracture of distal end of right tibia, unspecified fracture morphology, initial  encounter     Rx / DC Orders   ED Discharge Orders          Ordered    oxyCODONE  (ROXICODONE ) 5 MG immediate release tablet  Every 6 hours PRN        07/11/23 0423             Note:  This document was prepared using Dragon voice recognition software and may include unintentional dictation errors.   Ernest Ronal BRAVO, MD 07/11/23 [phone]

## 2023-07-16 ENCOUNTER — Other Ambulatory Visit: Payer: Self-pay | Admitting: Podiatry

## 2023-07-16 ENCOUNTER — Ambulatory Visit
Admission: RE | Admit: 2023-07-16 | Discharge: 2023-07-16 | Disposition: A | Discharge status: Home / Self Care | Payer: Self-pay | Source: Ambulatory Visit | Attending: Podiatry | Admitting: Podiatry

## 2023-07-16 DIAGNOSIS — S82891K Other fracture of right lower leg, subsequent encounter for closed fracture with nonunion: Secondary | ICD-10-CM

## 2023-07-16 DIAGNOSIS — S82851A Displaced trimalleolar fracture of right lower leg, initial encounter for closed fracture: Secondary | ICD-10-CM

## 2023-07-20 ENCOUNTER — Encounter
Admission: RE | Admit: 2023-07-20 | Discharge: 2023-07-20 | Disposition: A | Discharge status: Home / Self Care | Payer: Self-pay | Source: Ambulatory Visit | Attending: Podiatry | Admitting: Podiatry

## 2023-07-20 ENCOUNTER — Other Ambulatory Visit: Payer: Self-pay

## 2023-07-20 VITALS — Ht 64.0 in | Wt 176.0 lb

## 2023-07-20 DIAGNOSIS — Z01812 Encounter for preprocedural laboratory examination: Secondary | ICD-10-CM

## 2023-07-20 HISTORY — DX: Prediabetes: R73.03

## 2023-07-20 NOTE — Patient Instructions (Addendum)
Su procedimiento est programado para: Jueves 07/22/23  Presntese en el mostrador de Tax adviser del [employer] Para saber su hora de llegada, llame al [phone] entre la 1:00 p. m. y las 3:00 p. m. en: Miercoles 07/21/23  Si su hora de llegada es a las 6:00 am, no llegue antes de esa hora ya que las puertas de [place] [facility] no se abren Teacher, adult education las 6:00 am.  RECORDAR: Las instrucciones que no se siguen completamente pueden provocar riesgos mdicos graves, que pueden llegar hasta la [place]; o, segn el criterio de su cirujano y Scientific laboratory technician, es posible que sea Aeronautical engineer su Leisure centre manager.  No ingiera alimentos despus de la medianoche del da anterior a la ciruga. No mascar chicle ni caramelos duros.  Sin embargo, puede beber lquidos [employer] 2 horas antes de la fecha prevista de llegada a la [place]. No beba nada dentro de las 2 horas anteriores a su hora de Nurse, learning disability.  Los lquidos claros incluyen: - agua - jugo de manzana sin pulpa - gatorade (no colores ROJOS) - caf o t negro (NO agregue leche ni cremas al caf o t) NO beba nada que no est en esta lista.   Adems, su mdico le ha recetado que beba lo siguiente: Asegrese de beber una bebida clara con carbohidratos antes de la Midwife esta bebida con carbohidratos [employer]OK horas antes de la ciruga ayuda a reducir la resistencia a la insulina y [employer] de [place]. Termine de beber 2 horas antes de la hora de llegada programada.  Una semana antes de la ciruga: Detenga los antiinflamatorios (AINE) como Advil, Aleve, Ibuprofeno, Motrin, Naproxen, Naprosyn y productos a base de aspirina como Excedrin, Goody's Powder, BC Powder. Suspenda CUALQUIER suplemento de venta libre hasta despus de la [place]. Sin embargo, puede Educational psychologist tomando Tylenol si es necesario para Marketing executive de la [place]. Contine tomando todos los medicamentos recetados,  excepto los siguientes:   Siga las recomendaciones del cardilogo o PCP con respecto a suspender los anticoagulantes.  TOME SLO ESTOS MEDICAMENTOS LA MAANA DE LA CIRUGA CON UN SORBO DE AGUA:  Ninguna    No consumir alcohol durante 24 horas antes o despus de la [place].  No fumar, incluidos los cigarrillos electrnicos, durante las 24 horas previas a la [place]. No consumir productos de tabaco masticables durante al menos 6 horas antes de la [place]. Sin parches de Optometrist de la [place].  No use ningn medicamento "recreativo" durante al menos una semana (preferiblemente 2 semanas) antes de la ciruga. Tenga en cuenta que la combinacin de cocana y anestesia puede [employer], que pueden llegar hasta la [place]. Si su prueba de cocana da positivo, su ciruga ser cancelada.  La maana de la ciruga cepille sus dientes con pasta dental y agua, puede enjuagarse la boca con enjuague bucal si lo desea. No ingiera pasta de dientes ni enjuague bucal.  Utilice jabn o toallitas CHG como se indica en la hoja de instrucciones.  No use joyas, maquillaje, horquillas, clips ni esmalte de uas.  No use lociones, polvos ni perfumes.  No se afeite el vello corporal desde el cuello hacia abajo 48 horas antes de la [place].  No se pueden usar lentes de contacto, audfonos ni dentaduras postizas durante la [place].  No lleve objetos de valor al hospital. [facility] no es responsable de ninguna pertenencia u objeto de valor perdido o perdido. Artroplastia total de  hombro: use gel de perxido de benzoilo al 5 % como se indica en la hoja de instrucciones.  Lleve su C-PAP al hospital en caso de que tenga que pasar la noche.  Notifique a su mdico si hay algn cambio en su condicin mdica (resfriado, fiebre, infeccin).  Lleve ropa cmoda (especfica para su tipo de [place]) al hospital.  Despus de la ciruga, usted puede ayudar a prevenir complicaciones pulmonares  haciendo ejercicios de respiracin. Respire profundamente y tosa cada 1 o 2 horas. Su mdico puede indicarle un dispositivo llamado espirmetro incentivador para ayudarle a respirar profundamente. Al toser o Engineering geologist, sostenga firmemente una almohada contra la incisin con ambas manos. Esto se llama "ferulizacin". Hacer esto ayuda a proteger su incisin. Tambin disminuye las molestias abdominales.  Si vas a pasar la noche en el hospital, deja tu maleta en el coche. Despus de la [place], es posible que lo lleven a su habitacin.  En caso de un mayor censo de [place], puede ser necesario que usted, el [place], contine con su atencin posoperatoria en el departamento de [place] el [place]ismo Da.  Si le dan el alta el da de la [place], no se le permitir conducir a casa. Necesitar que una persona responsable lo lleve a su casa y se quede con usted durante las 24 horas posteriores a la [place].  Si viaja en transporte pblico, deber ir acompaado de una persona responsable.  Llame al Departamento de pruebas previas a la admisin al [phone] si tiene alguna pregunta sobre estas instrucciones.  Poltica de visitas a ciruga:  [employer] se someten a [place] o procedimiento pueden [employer] familiares o personas de apoyo con ellos, siempre y cuando la persona no sea positiva para COVID-19 ni experimente sus sntomas.  Visitas para pacientes hospitalizados:  El horario de visita es de 7 a 20 horas. Se permiten hasta cuatro visitantes a la vez en la habitacin de un paciente. Los visitantes podrn rotar con Garment/textile technologist. Neomia Dear persona de apoyo designada (adulto) podr pasar la noche.  Debido a un aumento en las tasas de VSR e influenza y las hospitalizaciones asociadas, los nios menores de 12 aos no podrn visitar a los pacientes en los hospitales de [employer]. Se siguen recomendando encarecidamente las mascarillas.  Preparacin para la ciruga con jabn  de GLUCONATO DE CLORHEXIDINA (CHG)  Jabn de gluconato de clorhexidina (CHG)  o Un limpiador antisptico que [employer] grmenes y se adhiere a la piel para seguir [employer] grmenes incluso despus del lavado.  o Se utiliza para ducharse la noche anterior a la [place] y la maana de la [place].  Antes de la [place], usted puede desempear un papel importante al reducir la cantidad de grmenes en su piel. El jabn CHG (gluconato de clorhexidina) es un limpiador antisptico que mata los grmenes y se adhiere a la piel para continuar matndolos incluso despus del lavado.  No lo utilice si es alrgico al CHG o a los jabones antibacterianos. Si su piel se enrojece o irrita, deje de usar CHG.  1. Ducharse la [place] ANTES DE LA CIRUGA y la [place] DE LA CIRUGA con jabn CHG.  2. Si eliges lavarte el cabello, lvalo primero como de costumbre con tu champ habitual.  3. Despus del champ, enjuague bien el cabello y el cuerpo para eliminar el champ.  4. Utilice CHG como lo hara con cualquier otro jabn lquido. Puede aplicar CHG directamente sobre la piel y lavar suavemente con  un pauelo o una toallita limpia.  5. Aplique el jabn CHG en su cuerpo nicamente desde el cuello hacia abajo. No utilizar en heridas abiertas o llagas abiertas. Evite el contacto con los ojos, odos, boca y genitales (partes privadas). Lvese la cara y los genitales (partes privadas) con su jabn habitual.  6. Lvese bien, prestando especial atencin al rea donde se realizar su ciruga.  7. Enjuague bien su cuerpo con agua tibia.  8. No se duche ni se lave con su jabn normal despus de usar y enjuagar el jabn CHG.  9. Squese dando palmaditas con una toalla limpia.  10. Use pijamas limpios para dormir la noche anterior a la ciruga.  12. Coloque sbanas limpias en su cama la noche de su primera ducha y no duerma con mascotas.  13. Ducharse nuevamente con el jabn CHG el da de la ciruga antes de llegar al  hospital.  14. No aplique desodorantes, lociones o polvos.  15. Por favor use ropa limpia al hospital.

## 2023-07-22 ENCOUNTER — Encounter
Admission: RE | Disposition: A | Discharge status: Home / Self Care | Payer: Self-pay | Source: Home / Self Care | Attending: Podiatry

## 2023-07-22 ENCOUNTER — Ambulatory Visit: Payer: Self-pay

## 2023-07-22 ENCOUNTER — Ambulatory Visit
Admission: RE | Admit: 2023-07-22 | Discharge: 2023-07-22 | Disposition: A | Discharge status: Home / Self Care | Payer: Self-pay | Attending: Podiatry | Admitting: Podiatry

## 2023-07-22 ENCOUNTER — Encounter: Payer: Self-pay | Admitting: Podiatry

## 2023-07-22 ENCOUNTER — Ambulatory Visit: Payer: Self-pay | Admitting: Urgent Care

## 2023-07-22 ENCOUNTER — Other Ambulatory Visit: Payer: Self-pay

## 2023-07-22 ENCOUNTER — Ambulatory Visit: Payer: Self-pay | Admitting: Anesthesiology

## 2023-07-22 DIAGNOSIS — S82851A Displaced trimalleolar fracture of right lower leg, initial encounter for closed fracture: Secondary | ICD-10-CM | POA: Insufficient documentation

## 2023-07-22 DIAGNOSIS — X58XXXA Exposure to other specified factors, initial encounter: Secondary | ICD-10-CM | POA: Insufficient documentation

## 2023-07-22 DIAGNOSIS — Z01812 Encounter for preprocedural laboratory examination: Secondary | ICD-10-CM

## 2023-07-22 HISTORY — PX: ORIF ANKLE FRACTURE: SHX5408

## 2023-07-22 SURGERY — OPEN REDUCTION INTERNAL FIXATION (ORIF) ANKLE FRACTURE
Anesthesia: General | Site: Ankle | Laterality: Right

## 2023-07-22 MED ORDER — ONDANSETRON HCL 4 MG PO TABS: 4.0000 mg | ORAL_TABLET | Freq: Four times a day (QID) | ORAL | Status: DC | PRN

## 2023-07-22 MED ORDER — OXYCODONE HCL 5 MG PO TABS
5.0000 mg | ORAL_TABLET | Freq: Once | ORAL | Status: AC | PRN
  Administered 2023-07-22: 5 mg via ORAL

## 2023-07-22 MED ORDER — ACETAMINOPHEN 10 MG/ML IV SOLN
INTRAVENOUS | Status: AC
  Filled 2023-07-22: qty 100

## 2023-07-22 MED ORDER — LACTATED RINGERS IV SOLN: INTRAVENOUS | Status: DC

## 2023-07-22 MED ORDER — LACTATED RINGERS IV SOLN: INTRAVENOUS | Status: DC | PRN

## 2023-07-22 MED ORDER — OXYCODONE HCL 5 MG PO TABS
5.0000 mg | ORAL_TABLET | Freq: Once | ORAL | Status: AC
  Administered 2023-07-22: 5 mg via ORAL

## 2023-07-22 MED ORDER — SUGAMMADEX SODIUM 200 MG/2ML IV SOLN
INTRAVENOUS | Status: DC | PRN
  Administered 2023-07-22: 200 mg via INTRAVENOUS

## 2023-07-22 MED ORDER — MIDAZOLAM HCL 2 MG/2ML IJ SOLN
INTRAMUSCULAR | Status: AC
  Filled 2023-07-22: qty 2

## 2023-07-22 MED ORDER — CHLORHEXIDINE GLUCONATE 0.12 % MT SOLN
15.0000 mL | Freq: Once | OROMUCOSAL | Status: AC
  Administered 2023-07-22: 15 mL via OROMUCOSAL

## 2023-07-22 MED ORDER — FENTANYL CITRATE (PF) 100 MCG/2ML IJ SOLN
INTRAMUSCULAR | Status: DC | PRN
  Administered 2023-07-22 (×4): 50 ug via INTRAVENOUS

## 2023-07-22 MED ORDER — ONDANSETRON HCL 4 MG/2ML IJ SOLN
INTRAMUSCULAR | Status: DC | PRN
  Administered 2023-07-22: 4 mg via INTRAVENOUS

## 2023-07-22 MED ORDER — FENTANYL CITRATE (PF) 100 MCG/2ML IJ SOLN
INTRAMUSCULAR | Status: AC
  Filled 2023-07-22: qty 2

## 2023-07-22 MED ORDER — DEXMEDETOMIDINE HCL IN NACL 80 MCG/20ML IV SOLN
INTRAVENOUS | Status: DC | PRN
  Administered 2023-07-22: 8 ug via INTRAVENOUS

## 2023-07-22 MED ORDER — LIDOCAINE HCL (PF) 1 % IJ SOLN
INTRAMUSCULAR | Status: AC
  Filled 2023-07-22: qty 5

## 2023-07-22 MED ORDER — OXYCODONE HCL 5 MG/5ML PO SOLN: 5.0000 mg | Freq: Once | ORAL | Status: AC | PRN

## 2023-07-22 MED ORDER — PROPOFOL 10 MG/ML IV BOLUS
INTRAVENOUS | Status: AC
  Filled 2023-07-22: qty 20

## 2023-07-22 MED ORDER — FENTANYL CITRATE (PF) 100 MCG/2ML IJ SOLN
25.0000 ug | INTRAMUSCULAR | Status: DC | PRN
  Administered 2023-07-22 (×2): 50 ug via INTRAVENOUS

## 2023-07-22 MED ORDER — CHLORHEXIDINE GLUCONATE 0.12 % MT SOLN
OROMUCOSAL | Status: AC
  Filled 2023-07-22: qty 15

## 2023-07-22 MED ORDER — PROPOFOL 10 MG/ML IV BOLUS
INTRAVENOUS | Status: DC | PRN
  Administered 2023-07-22: 110 mg via INTRAVENOUS

## 2023-07-22 MED ORDER — LIDOCAINE HCL (CARDIAC) PF 100 MG/5ML IV SOSY
PREFILLED_SYRINGE | INTRAVENOUS | Status: DC | PRN
  Administered 2023-07-22: 40 mg via INTRAVENOUS

## 2023-07-22 MED ORDER — CEFAZOLIN SODIUM-DEXTROSE 2-4 GM/100ML-% IV SOLN
INTRAVENOUS | Status: AC
  Filled 2023-07-22: qty 100

## 2023-07-22 MED ORDER — MIDAZOLAM HCL 2 MG/2ML IJ SOLN
1.0000 mg | INTRAMUSCULAR | Status: DC | PRN
  Administered 2023-07-22: 1 mg via INTRAVENOUS

## 2023-07-22 MED ORDER — PHENYLEPHRINE 80 MCG/ML (10ML) SYRINGE FOR IV PUSH (FOR BLOOD PRESSURE SUPPORT)
PREFILLED_SYRINGE | INTRAVENOUS | Status: DC | PRN
  Administered 2023-07-22: 160 ug via INTRAVENOUS
  Administered 2023-07-22 (×3): 80 ug via INTRAVENOUS

## 2023-07-22 MED ORDER — ORAL CARE MOUTH RINSE
15.0000 mL | Freq: Once | OROMUCOSAL | Status: AC
Start: 2023-07-22 — End: 2023-07-22

## 2023-07-22 MED ORDER — CEFAZOLIN SODIUM-DEXTROSE 2-4 GM/100ML-% IV SOLN
2.0000 g | INTRAVENOUS | Status: AC
  Administered 2023-07-22: 2 g via INTRAVENOUS

## 2023-07-22 MED ORDER — LIDOCAINE HCL (PF) 2 % IJ SOLN
INTRAMUSCULAR | Status: AC
  Filled 2023-07-22: qty 5

## 2023-07-22 MED ORDER — DEXMEDETOMIDINE HCL IN NACL 80 MCG/20ML IV SOLN
INTRAVENOUS | Status: AC
  Filled 2023-07-22: qty 20

## 2023-07-22 MED ORDER — METOCLOPRAMIDE HCL 10 MG PO TABS: 5.0000 mg | ORAL_TABLET | Freq: Three times a day (TID) | ORAL | Status: DC | PRN

## 2023-07-22 MED ORDER — 0.9 % SODIUM CHLORIDE (POUR BTL) OPTIME
TOPICAL | Status: DC | PRN
  Administered 2023-07-22: 1000 mL

## 2023-07-22 MED ORDER — DEXAMETHASONE SODIUM PHOSPHATE 10 MG/ML IJ SOLN
INTRAMUSCULAR | Status: DC | PRN
  Administered 2023-07-22: 10 mg via INTRAVENOUS

## 2023-07-22 MED ORDER — OXYCODONE-ACETAMINOPHEN 5-325 MG PO TABS: 1.0000 | ORAL_TABLET | Freq: Four times a day (QID) | ORAL | 0 refills | Status: AC | PRN

## 2023-07-22 MED ORDER — FENTANYL CITRATE PF 50 MCG/ML IJ SOSY
PREFILLED_SYRINGE | INTRAMUSCULAR | Status: AC
  Filled 2023-07-22: qty 1

## 2023-07-22 MED ORDER — BUPIVACAINE HCL (PF) 0.5 % IJ SOLN
INTRAMUSCULAR | Status: AC
  Filled 2023-07-22: qty 20

## 2023-07-22 MED ORDER — ROCURONIUM BROMIDE 100 MG/10ML IV SOLN
INTRAVENOUS | Status: DC | PRN
  Administered 2023-07-22: 20 mg via INTRAVENOUS
  Administered 2023-07-22: 60 mg via INTRAVENOUS
  Administered 2023-07-22: 20 mg via INTRAVENOUS

## 2023-07-22 MED ORDER — METOCLOPRAMIDE HCL 5 MG/ML IJ SOLN: 5.0000 mg | Freq: Three times a day (TID) | INTRAMUSCULAR | Status: DC | PRN

## 2023-07-22 MED ORDER — ACETAMINOPHEN 10 MG/ML IV SOLN
INTRAVENOUS | Status: DC | PRN
  Administered 2023-07-22: 1000 mg via INTRAVENOUS

## 2023-07-22 MED ORDER — BUPIVACAINE HCL (PF) 0.25 % IJ SOLN
INTRAMUSCULAR | Status: AC
  Filled 2023-07-22: qty 30

## 2023-07-22 MED ORDER — ONDANSETRON HCL 4 MG/2ML IJ SOLN: 4.0000 mg | Freq: Four times a day (QID) | INTRAMUSCULAR | Status: DC | PRN

## 2023-07-22 MED ORDER — ONDANSETRON HCL 4 MG/2ML IJ SOLN
INTRAMUSCULAR | Status: AC
  Filled 2023-07-22: qty 2

## 2023-07-22 MED ORDER — DEXAMETHASONE SODIUM PHOSPHATE 10 MG/ML IJ SOLN
INTRAMUSCULAR | Status: AC
  Filled 2023-07-22: qty 1

## 2023-07-22 MED ORDER — BUPIVACAINE HCL (PF) 0.5 % IJ SOLN
INTRAMUSCULAR | Status: DC | PRN
  Administered 2023-07-22: 20 mL via PERINEURAL

## 2023-07-22 MED ORDER — OXYCODONE HCL 5 MG PO TABS
ORAL_TABLET | ORAL | Status: AC
  Filled 2023-07-22: qty 1

## 2023-07-22 MED ORDER — BUPIVACAINE HCL (PF) 0.25 % IJ SOLN
INTRAMUSCULAR | Status: DC | PRN
  Administered 2023-07-22: 20 mL

## 2023-07-22 MED ORDER — FENTANYL CITRATE PF 50 MCG/ML IJ SOSY
50.0000 ug | PREFILLED_SYRINGE | Freq: Once | INTRAMUSCULAR | Status: AC
  Administered 2023-07-22: 50 ug via INTRAVENOUS

## 2023-07-22 SURGICAL SUPPLY — 66 items
BIT DRILL 2.0X130 SLD AO (BIT) IMPLANT
BIT DRILL 2.4X180 SLD AO (BIT) IMPLANT
BLADE SURG 15 STRL LF DISP TIS (BLADE) IMPLANT
BNDG COHESIVE 4X5 TAN STRL LF (GAUZE/BANDAGES/DRESSINGS) ×1 IMPLANT
BNDG ELASTIC 4X5.8 VLCR NS LF (GAUZE/BANDAGES/DRESSINGS) ×1 IMPLANT
BNDG ESMARCH 4X12 STRL LF (GAUZE/BANDAGES/DRESSINGS) ×1 IMPLANT
BNDG GAUZE DERMACEA FLUFF 4 (GAUZE/BANDAGES/DRESSINGS) ×1 IMPLANT
BNDG STRETCH GAUZE 3IN X12FT (GAUZE/BANDAGES/DRESSINGS) ×1 IMPLANT
CUFF TOURN SGL QUICK 18X4 (TOURNIQUET CUFF) IMPLANT
CUFF TOURN SGL QUICK 30 NS (TOURNIQUET CUFF) IMPLANT
CUFF TRNQT CYL 24X4X16.5-23 (TOURNIQUET CUFF) IMPLANT
DRAPE C-ARM XRAY 36X54 (DRAPES) ×1 IMPLANT
DRAPE C-ARMOR (DRAPES) ×1 IMPLANT
DRAPE EXTREMITY T 121X128X90 (DISPOSABLE) IMPLANT
DURAPREP 26ML APPLICATOR (WOUND CARE) ×1 IMPLANT
ELECT REM PT RETURN 9FT ADLT (ELECTROSURGICAL) ×1
ELECTRODE REM PT RTRN 9FT ADLT (ELECTROSURGICAL) ×1 IMPLANT
GAUZE SPONGE 4X4 12PLY STRL (GAUZE/BANDAGES/DRESSINGS) ×1 IMPLANT
GAUZE STRETCH 2X75IN STRL (MISCELLANEOUS) ×1 IMPLANT
GAUZE XEROFORM 1X8 LF (GAUZE/BANDAGES/DRESSINGS) ×1 IMPLANT
GLOVE BIO SURGEON STRL SZ7.5 (GLOVE) ×1 IMPLANT
GLOVE INDICATOR 8.0 STRL GRN (GLOVE) ×1 IMPLANT
GOWN STRL REUS W/ TWL XL LVL3 (GOWN DISPOSABLE) ×1 IMPLANT
GOWN STRL REUS W/TWL XL LVL4 (GOWN DISPOSABLE) ×1 IMPLANT
K-WIRE SMOOTH 1.6X150MM (WIRE) ×1
KIT TURNOVER KIT A (KITS) ×1 IMPLANT
KWIRE SMOOTH 1.6X150MM (WIRE) IMPLANT
LABEL OR SOLS (LABEL) ×1 IMPLANT
MANIFOLD NEPTUNE II (INSTRUMENTS) ×1 IMPLANT
NDL HYPO 22X1.5 SAFETY MO (MISCELLANEOUS) ×1 IMPLANT
NEEDLE HYPO 22X1.5 SAFETY MO (MISCELLANEOUS) ×1
NS IRRIG 500ML POUR BTL (IV SOLUTION) ×1 IMPLANT
PACK EXTREMITY ARMC (MISCELLANEOUS) ×1 IMPLANT
PAD PREP OB/GYN DISP 24X41 (PERSONAL CARE ITEMS) ×1 IMPLANT
PENCIL SMOKE EVACUATOR (MISCELLANEOUS) IMPLANT
PLATE LOCK TIB 6H RT (Plate) IMPLANT
PLATE POST GORILLA 7H LR (Plate) IMPLANT
PLATE TIBIA PREC 7H (Plate) IMPLANT
SCREW CANN NL 3.5X40 (Screw) IMPLANT
SCREW LOCK PLATE R3 2.7X12 (Screw) IMPLANT
SCREW LOCK PLATE R3 2.7X14 (Screw) IMPLANT
SCREW LOCK PLATE R3 2.7X16 (Screw) IMPLANT
SCREW LOCK PLATE R3 3.5X12 (Screw) IMPLANT
SCREW LOCK PLATE R3 3.5X16 (Screw) IMPLANT
SCREW LOCK PLATE R3 3.5X22 (Screw) IMPLANT
SCREW LOCK PLATE R3 3.5X24 (Screw) IMPLANT
SCREW LOCK PLATE R3 3.5X30 (Screw) IMPLANT
SCREW LOCK PLATE R3 3.5X34 (Screw) IMPLANT
SCREW LOCK PLT 36X3.5X GRLL (Screw) IMPLANT
SCREW NONLOCKING 3.5X24 (Screw) IMPLANT
SCREW PLT 36X3.5X NONLOCK (Screw) IMPLANT
SPLINT CAST 1 STEP 4X30 (MISCELLANEOUS) ×1 IMPLANT
SPLINT PLASTER CAST FAST 5X30 (CAST SUPPLIES) ×1 IMPLANT
SPONGE T-LAP 18X18 ~~LOC~~+RFID (SPONGE) ×1 IMPLANT
STAPLER SKIN PROX 35W (STAPLE) ×1 IMPLANT
STOCKINETTE M/LG 89821 (MISCELLANEOUS) ×1 IMPLANT
STRAP SAFETY 5IN WIDE (MISCELLANEOUS) ×1 IMPLANT
STRIP CLOSURE SKIN 1/2X4 (GAUZE/BANDAGES/DRESSINGS) IMPLANT
SUT VIC AB 2-0 CT1 TAPERPNT 27 (SUTURE) ×1 IMPLANT
SUT VIC AB 3-0 SH 27X BRD (SUTURE) ×1 IMPLANT
SWABSTK COMLB BENZOIN TINCTURE (MISCELLANEOUS) IMPLANT
SYR 10ML LL (SYRINGE) ×1 IMPLANT
SYR 50ML LL SCALE MARK (SYRINGE) ×1 IMPLANT
TRAP FLUID SMOKE EVACUATOR (MISCELLANEOUS) ×1 IMPLANT
WATER STERILE IRR 500ML POUR (IV SOLUTION) ×1 IMPLANT
WIRE OLIVE SMOOTH 1.4MMX60MM (WIRE) IMPLANT

## 2023-07-22 NOTE — Anesthesia Procedure Notes (Addendum)
Anesthesia Regional Block: Popliteal block   Pre-Anesthetic Checklist: , timeout performed,  Correct Patient, Correct Site, Correct Laterality,  Correct Procedure, Correct Position, site marked,  Risks and benefits discussed,  Surgical consent,  Pre-op evaluation,  At surgeon's request and post-op pain management  Laterality: Lower and Right  Prep: chloraprep       Needles:  Injection technique: Single-shot  Needle Type: Echogenic Needle     Needle Length: 9cm  Needle Gauge: 21     Additional Needles:   Procedures:,,,, ultrasound used (permanent image in chart),,    Narrative:  Start time: 07/22/2023 7:29 AM End time: 07/22/2023 7:30 AM Injection made incrementally with aspirations every 5 mL.  Performed by: Personally   Additional Notes: Patient consented for risk and benefits of nerve block including but not limited to nerve damage, failed block, bleeding and infection.  Patient voiced understanding.  Functioning IV was confirmed and monitors were applied.  Timeout done prior to procedure and prior to any sedation being given to the patient.  Patient confirmed procedure site prior to any sedation given to the patient.  A 50mm 22ga Stimuplex needle was used. Sterile prep,hand hygiene and sterile gloves were used.  Minimal sedation used for procedure.  No paresthesia endorsed by patient during the procedure.  Negative aspiration and negative test dose prior to incremental administration of local anesthetic. The patient tolerated the procedure well with no immediate complications.

## 2023-07-22 NOTE — Transfer of Care (Signed)
Immediate Anesthesia Transfer of Care Note  Patient: Heather Branch  Procedure(s) Performed: OPEN REDUCTION INTERNAL FIXATION (ORIF) ANKLE FRACTURE TRIMALLEOLAR (Right: Ankle)  Patient Location: PACU  Anesthesia Type:General  Level of Consciousness: awake, alert , and oriented  Airway & Oxygen Therapy: Patient Spontanous Breathing  Post-op Assessment: Report given to RN and Post -op Vital signs reviewed and stable  Post vital signs: Reviewed and stable  Last Vitals:  Vitals Value Taken Time  BP 122/67 07/22/23 1046  Temp 35.6 C 07/22/23 1046  Pulse 75 07/22/23 1047  Resp 18 07/22/23 1047  SpO2 100 % 07/22/23 1047  Vitals shown include unfiled device data.  Last Pain:  Vitals:   07/22/23 1046  TempSrc:   PainSc: 0-No pain         Complications: No notable events documented.

## 2023-07-22 NOTE — Discharge Instructions (Addendum)
[facility]  POST OPERATIVE INSTRUCTIONS FOR DR. Ether Griffins AND DR. BAKER [facility] CLINIC PODIATRY DEPARTMENT   Take your medication as prescribed.  Pain medication should be taken only as needed.  We recommend taking an aspirin daily if you tolerate aspirin.  Keep the dressing clean, dry and intact.  Keep your foot elevated above the heart level for the first 48 hours.  We have instructed you to be non-weight bearing.  Always wear your post-op shoe when walking.  Always use your crutches if you are to be non-weight bearing.  Do not take a shower. Baths are permissible as long as the foot is kept out of the water.   Every hour you are awake:  Bend your knee 15 times.  Call [facility] [phone]) if any of the following problems occur: You develop a temperature or fever. The bandage becomes saturated with blood. Medication does not stop your pain. Injury of the foot occurs. Any symptoms of infection including redness, odor, or red streaks running from wound.   Instrucciones [facility] [place] [facility]   INSTRUCCIONES POSTOPERATORIAS PARA EL DR. Bradd Canary EL DR. PANADERO DEPART[facility]O DE [facility] DE [facility] [place]     1. Tome su medicamento segn lo recetado.  Los analgsicos deben tomarse slo segn sea necesario.  Recomendamos tomar una aspirina diariamente si tolera la aspirina.   2. Mantenga el apsito limpio, seco e intacto.   3. Mantenga el pie elevado por encima del nivel del corazn durante las primeras 48 horas.   4. Le hemos indicado que no soporte peso.   5. Utilice siempre su zapato postoperatorio cuando camine.  Utilice siempre muletas si no va a soportar peso.   6. No te duches. Los baos estn permitidos siempre que el pie se mantenga fuera del [place].    7. Cada hora que ests despierto:  ? Doble la rodilla 15 veces.   8. Llame a [facility] [phone]) si  ocurre alguno de los siguientes problemas: ? Usted desarrolla temperatura o fiebre. ? El vendaje se satura de [place]. ? Los medicamentos no Arboriculturist. ? Se produce lesin del pie. ? Cualquier sntoma de infeccin, incluido enrojecimiento, olor o rayas rojas que salen de la herida. ? Open in Doctor, general practice  Google Translate https://translate.google.com Google's service, offered free of charge, instantly translates words, phrases, and web pages between

## 2023-07-22 NOTE — Anesthesia Postprocedure Evaluation (Signed)
Anesthesia Post Note  Patient: Jadea Bibbee  Procedure(s) Performed: OPEN REDUCTION INTERNAL FIXATION (ORIF) ANKLE FRACTURE TRIMALLEOLAR (Right: Ankle)  Patient location during evaluation: PACU Anesthesia Type: General Level of consciousness: awake and alert Pain management: pain level controlled Vital Signs Assessment: post-procedure vital signs reviewed and stable Respiratory status: spontaneous breathing, nonlabored ventilation, respiratory function stable and patient connected to nasal cannula oxygen Cardiovascular status: blood pressure returned to baseline and stable Postop Assessment: no apparent nausea or vomiting Anesthetic complications: no   No notable events documented.   Last Vitals:  Vitals:   07/22/23 1130 07/22/23 1145  BP: (!) 121/59 124/72  Pulse: 87 89  Resp: 13 14  Temp: 37.1 C 36.9 C  SpO2: 98% 99%    Last Pain:  Vitals:   07/22/23 1145  TempSrc:   PainSc: 7                  Cleda Mccreedy Aseret Hoffman

## 2023-07-22 NOTE — Op Note (Signed)
Operative note   Surgeon:Kristi Hyer Armed forces logistics/support/administrative officer: None    Preop diagnosis: Trimalleolar ankle fracture right ankle    Postop diagnosis: Same    Procedure: Open reduction with internal fixation trimalleolar ankle fracture right ankle    EBL: Minimal    Anesthesia:regional and general.  Patient received a popliteal saphenous block in the preoperative holding area.  Along both incision sites this was supplemented with a total of 20 cc of 0.25% bupivacaine plain.    Hemostasis: Thigh tourniquet inflated to 80 minutes for the posterior and fibular fracture reduction and deflated for 30 minutes.  Reinflated for 40 minutes for the medial malleolus fracture    Specimen: None    Complications: None    Operative indications:Heather Branch is an 58 y.o. that presents today for surgical intervention.  The risks/benefits/alternatives/complications have been discussed and consent has been given.    Procedure:  Patient was brought into the OR and placed on the operating table in theprone position. After anesthesia was obtained theright lower extremity was prepped and draped in usual sterile fashion.  Attention was initially directed to the posterior lateral aspect of the ankle where an incision was made between the Achilles tendon and fibula.  Sharp and blunt dissection carried down through the subcutaneous tissue to the deeper tissue.  At this time the peroneal tendons were noted and retracted lateral and the FHL muscle and tendon were retracted medial.  Subperiosteal dissection was then performed on the distal tibial fracture.  A large posterior fracture was noted.  This was then reduced and temporarily stabilized.  A posterior malleoli are fracture plate from the [employer] set was then placed and stabilized with 3.5 millimeter screws.  A combination of locking and nonlocking screws were used.  Good reduction was noted in all planes.  Attention was then directed to the fibula.  Subperiosteal  dissection was performed and the distal fibular fracture was noted.  This was reduced with a bone reduction clamp.  A posterior fibular plate from the [employer] set was used with 2.7 millimeter screws.  These were placed both proximal and distal.  Good realignment was noted.  The wound was flushed with copious amounts of irrigation.  Layered closure was performed with a 2-0 and 3-0 Vicryl with skin staples.  The area was then covered with a sterile Tegaderm.  The patient was then placed in the supine position and resterile prep and drape was performed.  Attention was directed to the medial malleoli region.  A medial mall lateral incision was performed.  Sharp and blunt dissection carried down to the periosteum.  Subperiosteal dissection was performed.  The medial malleolar fracture was noted and reduced to a more anatomically aligned position.  A medial malleolar plate from the [employer] set was then used.  A combination of compression and locking screws were used for anatomic realignment.  Fluoroscopy was used and postreduction x-rays performed with good alignment and stability noted throughout.  The wound was flushed with copious amounts of irrigation with layered closure performed with a 3-0 and 4-0 Vicryl and skin staples.  A bulky sterile dressing and posterior splint was applied to the right lower extremity.    Patient tolerated the procedure and anesthesia well.  Was transported from the OR to the PACU with all vital signs stable and vascular status intact. To be discharged per routine protocol.  Will follow up in approximately 1 week in the outpatient clinic.

## 2023-07-22 NOTE — Anesthesia Procedure Notes (Signed)
Procedure Name: Intubation Date/Time: 07/22/2023 7:47 AM  Performed by: Karoline Caldwell, CRNAPre-anesthesia Checklist: Patient identified, Patient being monitored, Timeout performed, Emergency Drugs available and Suction available Patient Re-evaluated:Patient Re-evaluated prior to induction Oxygen Delivery Method: Circle system utilized Preoxygenation: Pre-oxygenation with 100% oxygen Induction Type: IV induction Ventilation: Mask ventilation without difficulty Laryngoscope Size: 3 and McGrath Grade View: Grade I Tube type: Oral Tube size: 7.0 mm Number of attempts: 1 Airway Equipment and Method: Stylet Placement Confirmation: ETT inserted through vocal cords under direct vision, positive ETCO2 and breath sounds checked- equal and bilateral Secured at: 20 cm Tube secured with: Tape Dental Injury: Teeth and Oropharynx as per pre-operative assessment

## 2023-07-22 NOTE — H&P (Signed)
HISTORY AND PHYSICAL INTERVAL NOTE:  07/22/2023  7:21 AM  Heather Branch  has presented today for surgery, with the diagnosis of S82.851A - Closed fracture of ankle, trimalleolar, right.  The various methods of treatment have been discussed with the patient.  No guarantees were given.  After consideration of risks, benefits and other options for treatment, the patient has consented to surgery.  I have reviewed the patients' chart and labs.     A history and physical examination was performed in my office.  The patient was reexamined.  There have been no changes to this history and physical examination.  Heather Branch A

## 2023-07-22 NOTE — Anesthesia Preprocedure Evaluation (Addendum)
Anesthesia Evaluation  Patient identified by MRN, date of birth, ID band Patient awake    Reviewed: Allergy & Precautions, NPO status , Patient's Chart, lab work & pertinent test results  History of Anesthesia Complications Negative for: history of anesthetic complications  Airway Mallampati: III  TM Distance: <3 FB Neck ROM: full    Dental  (+) Chipped   Pulmonary neg pulmonary ROS, neg shortness of breath   Pulmonary exam normal        Cardiovascular Exercise Tolerance: Good (-) angina negative cardio ROS Normal cardiovascular exam     Neuro/Psych negative neurological ROS  negative psych ROS   GI/Hepatic negative GI ROS, Neg liver ROS,neg GERD  ,,  Endo/Other  negative endocrine ROS    Renal/GU      Musculoskeletal   Abdominal   Peds  Hematology negative hematology ROS (+)   Anesthesia Other Findings Past Medical History: No date: Cholelithiasis No date: Gastritis No date: Pre-diabetes  Past Surgical History: 04/06/2022: BREAST BIOPSY; Right     Comment:  Stereo Bx, X-clip, Path Pending 04/22/2015: CHOLECYSTECTOMY; N/A     Comment:  Procedure: LAPAROSCOPIC CHOLECYSTECTOMY WITH               INTRAOPERATIVE CHOLANGIOGRAM;  Surgeon: Ricarda Frame,               MD;  Location: [facility] ORS;  Service: General;  Laterality:               N/A; No date: OTHER SURGICAL HISTORY     Reproductive/Obstetrics negative OB ROS                             Anesthesia Physical Anesthesia Plan  ASA: 2  Anesthesia Plan: General ETT   Post-op Pain Management: Regional block*   Induction: Intravenous  PONV Risk Score and Plan: Dexamethasone, Ondansetron, Midazolam and Treatment may vary due to age or medical condition  Airway Management Planned: Oral ETT  Additional Equipment:   Intra-op Plan:   Post-operative Plan: Extubation in OR  Informed Consent: I have reviewed the patients  History and Physical, chart, labs and discussed the procedure including the risks, benefits and alternatives for the proposed anesthesia with the patient or authorized representative who has indicated his/her understanding and acceptance.     Dental Advisory Given and Interpreter used for interview  Plan Discussed with: Anesthesiologist, CRNA and Surgeon  Anesthesia Plan Comments: (Patient consented for risks of anesthesia including but not limited to:  - adverse reactions to medications - damage to eyes, teeth, lips or other oral mucosa - nerve damage due to positioning  - sore throat or hoarseness - Damage to heart, brain, nerves, lungs, other parts of body or loss of life  Patient voiced understanding and assent.)       Anesthesia Quick Evaluation

## 2023-07-26 ENCOUNTER — Encounter: Payer: Self-pay | Admitting: Podiatry

## 2023-10-05 ENCOUNTER — Other Ambulatory Visit: Payer: Self-pay | Admitting: Family Medicine

## 2023-10-05 DIAGNOSIS — Z1231 Encounter for screening mammogram for malignant neoplasm of breast: Secondary | ICD-10-CM

## 2023-10-13 ENCOUNTER — Ambulatory Visit
Admission: RE | Admit: 2023-10-13 | Discharge: 2023-10-13 | Disposition: A | Discharge status: Home / Self Care | Payer: Self-pay | Source: Ambulatory Visit | Attending: Family Medicine | Admitting: Family Medicine

## 2023-10-13 DIAGNOSIS — Z1231 Encounter for screening mammogram for malignant neoplasm of breast: Secondary | ICD-10-CM | POA: Insufficient documentation
